# Patient Record
Sex: Male | Born: 1990 | Race: Black or African American | Hispanic: No | Marital: Single
Health system: Southern US, Community
[De-identification: ages and names within clinical notes are randomized; demographics above are authoritative.]

## PROBLEM LIST (undated history)

## (undated) HISTORY — PX: NO PAST SURGERIES: SHX2092

---
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
## 2010-09-04 ENCOUNTER — Emergency Department (HOSPITAL_COMMUNITY)
Admission: EM | Admit: 2010-09-04 | Discharge: 2010-09-04 | Disposition: A | Discharge status: Home / Self Care | Payer: Self-pay | Attending: Emergency Medicine | Admitting: Emergency Medicine

## 2010-09-04 DIAGNOSIS — R61 Generalized hyperhidrosis: Secondary | ICD-10-CM | POA: Insufficient documentation

## 2010-09-04 DIAGNOSIS — B9789 Other viral agents as the cause of diseases classified elsewhere: Secondary | ICD-10-CM | POA: Insufficient documentation

## 2010-09-04 DIAGNOSIS — R112 Nausea with vomiting, unspecified: Secondary | ICD-10-CM | POA: Insufficient documentation

## 2010-09-04 DIAGNOSIS — R109 Unspecified abdominal pain: Secondary | ICD-10-CM | POA: Insufficient documentation

## 2012-09-15 ENCOUNTER — Emergency Department (HOSPITAL_COMMUNITY): Payer: Self-pay

## 2012-09-15 ENCOUNTER — Encounter (HOSPITAL_COMMUNITY): Payer: Self-pay | Admitting: Emergency Medicine

## 2012-09-15 ENCOUNTER — Emergency Department (HOSPITAL_COMMUNITY)
Admission: EM | Admit: 2012-09-15 | Discharge: 2012-09-15 | Disposition: A | Discharge status: Home / Self Care | Payer: Self-pay | Attending: Emergency Medicine | Admitting: Emergency Medicine

## 2012-09-15 DIAGNOSIS — F17200 Nicotine dependence, unspecified, uncomplicated: Secondary | ICD-10-CM | POA: Insufficient documentation

## 2012-09-15 DIAGNOSIS — R51 Headache: Secondary | ICD-10-CM

## 2012-09-15 DIAGNOSIS — L659 Nonscarring hair loss, unspecified: Secondary | ICD-10-CM

## 2012-09-15 MED ORDER — TRAMADOL HCL 50 MG PO TABS: 50.0000 mg | ORAL_TABLET | Freq: Four times a day (QID) | ORAL | Status: DC | PRN

## 2012-09-15 NOTE — ED Provider Notes (Signed)
History     CSN: [id number]  Arrival date & time 09/15/12  1252   First MD Initiated Contact with Patient 09/15/12 1711      Chief Complaint  Patient presents with  . Headache    (Consider location/radiation/quality/duration/timing/severity/associated sxs/prior treatment) HPI Comments: Patient comes to the ER for evaluation of headache. Patient reports that he has been having left-sided headaches almost daily for several months. Patient reports that he was recently in prison and could not see a doctor. When he would go to sick call there, they would give him Motrin or Tylenol but it did not help. Patient denies any head injury. He does not have associated photophobia, nausea, vomiting. There is no neck pain or stiffness. He has not had a fever. Patient denies numbness, tingling, weakness in the extremities.  Patient is a 22 y.o. male presenting with headaches.  Headache   History reviewed. No pertinent past medical history.  History reviewed. No pertinent past surgical history.  History reviewed. No pertinent family history.  History  Substance Use Topics  . Smoking status: Current Every Day Smoker  . Smokeless tobacco: Not on file  . Alcohol Use: Yes      Review of Systems  Neurological: Positive for headaches.  All other systems reviewed and are negative.    Allergies  Review of patient's allergies indicates no known allergies.  Home Medications  No current outpatient prescriptions on file.  BP 129/56  Pulse 71  Temp(Src) 98.3 F (36.8 C) (Oral)  Resp 18  SpO2 100%  Physical Exam  Constitutional: He is oriented to person, place, and time. He appears well-developed and well-nourished. No distress.  HENT:  Head: Normocephalic and atraumatic.  Right Ear: Hearing normal.  Nose: Nose normal.  Mouth/Throat: Oropharynx is clear and moist and mucous membranes are normal.  Eyes: Conjunctivae and EOM are normal. Pupils are equal, round, and reactive to light.   Neck: Normal range of motion. Neck supple.  Cardiovascular: Normal rate, regular rhythm, S1 normal and S2 normal.  Exam reveals no gallop and no friction rub.   No murmur heard. Pulmonary/Chest: Effort normal and breath sounds normal. No respiratory distress. He exhibits no tenderness.  Abdominal: Soft. Normal appearance and bowel sounds are normal. There is no hepatosplenomegaly. There is no tenderness. There is no rebound, no guarding, no tenderness at McBurney's point and negative Murphy's sign. No hernia.  Musculoskeletal: Normal range of motion.  Neurological: He is alert and oriented to person, place, and time. He has normal strength. No cranial nerve deficit or sensory deficit. Coordination normal. GCS eye subscore is 4. GCS verbal subscore is 5. GCS motor subscore is 6.  Reflex Scores:      Tricep reflexes are 2+ on the right side and 2+ on the left side.      Bicep reflexes are 2+ on the right side and 2+ on the left side.      Patellar reflexes are 2+ on the right side and 2+ on the left side. Skin: Skin is warm, dry and intact. No rash noted. No cyanosis.  Psychiatric: He has a normal mood and affect. His speech is normal and behavior is normal. Thought content normal.    ED Course  Procedures (including critical care time)  Labs Reviewed - No data to display No results found.   Diagnosis: Headache    MDM  Patient comes to the ER for evaluation of headache. Patient reports that he has had a headache for weeks, probably months.  It seems like he has been having intermittent headaches, no continuous headache. Patient has normal neurologic function. Patient chronicity of these headaches it did not suspect subarachnoid hemorrhage. A CT scan was performed to further evaluate and was normal. Patient reassured, will be treated with anti-inflammatory and analgesia medications.        Gilda Crease, MD 09/15/12 [phone]

## 2012-09-15 NOTE — ED Notes (Signed)
Pt c/o HA and hair loss x several weeks; pt sts pain in left side of head

## 2012-09-15 NOTE — ED Notes (Signed)
Pt states he got out of prison on Sunday, has been seen there for h/a, pt states he has been having them for 5 mo. Pt states that h/a are intermittent, are always on the left side of head, denies sensitivity to light/sound. Pt states he has taken tylenol/ibuprofen with no relief.

## 2013-08-28 ENCOUNTER — Encounter (HOSPITAL_COMMUNITY): Payer: Self-pay | Admitting: Emergency Medicine

## 2013-08-28 ENCOUNTER — Emergency Department (HOSPITAL_COMMUNITY)
Admission: EM | Admit: 2013-08-28 | Discharge: 2013-08-28 | Disposition: A | Discharge status: Home / Self Care | Payer: Self-pay | Attending: Emergency Medicine | Admitting: Emergency Medicine

## 2013-08-28 DIAGNOSIS — F17200 Nicotine dependence, unspecified, uncomplicated: Secondary | ICD-10-CM | POA: Insufficient documentation

## 2013-08-28 DIAGNOSIS — B356 Tinea cruris: Secondary | ICD-10-CM | POA: Insufficient documentation

## 2013-08-28 MED ORDER — KETOCONAZOLE 2 % EX CREA: 1.0000 "application " | TOPICAL_CREAM | Freq: Every day | CUTANEOUS | Status: DC

## 2013-08-28 NOTE — ED Provider Notes (Signed)
Medical screening examination/treatment/procedure(s) were performed by non-physician practitioner and as supervising physician I was immediately available for consultation/collaboration.   EKG Interpretation None        Enid SkeensJoshua M Jozey Janco, MD 08/28/13 [phone]

## 2013-08-28 NOTE — ED Notes (Signed)
Pt states rash to lower abdomen and L thigh region. Ongoing for several days. C/o itching and discomfort. No signs of distress noted. Respirations unlabored.

## 2013-08-28 NOTE — ED Notes (Signed)
PT ambulated with baseline gait; VSS; A&Ox3; no signs of distress; respirations even and unlabored; skin warm and dry; no questions upon discharge.  

## 2013-08-28 NOTE — ED Notes (Signed)
Pt presents to department for evaluation of rash to lower abdomen and L thigh area. Ongoing x5 days. Pt states itching and discomfort. Pt is alert and oriented x4.

## 2013-08-28 NOTE — ED Provider Notes (Signed)
CSN: [id number]     Arrival date & time 08/28/13  1159 History  This chart was scribed for non-physician practitioner, Emilia BeckKaitlyn Abriel Hattery, PA-C, working with Enid SkeensJoshua M Zavitz, MD by Shari HeritageAisha Amuda, ED Scribe. This patient was seen in room TR03C/TR03C and the patient's care was started at 12:44 PM.    Chief Complaint  Patient presents with  . Rash     Patient is a 23 y.o. male presenting with rash. The history is provided by the patient. No language interpreter was used.  Rash Location:  Leg and ano-genital Ano-genital rash location:  Groin Leg rash location:  L upper leg Quality: itchiness   Duration:  5 days Timing:  Constant Associated symptoms: no fever, no nausea, no shortness of breath, no sore throat and not vomiting      HPI Comments: Candace Cruiseevin K Allshouse is a 23 y.o. male who presents to the Emergency Department complaining of a constant rash to his left inner thigh and groin onset 5 days ago. He states that rash is itchy occasionally. He denies fever, sore throat, nausea, vomiting, shortness or breath or any other symptoms at this time. Patient states that he does not work out frequently. He denies contact with a similar rash. He has no chronic medical conditions.    History reviewed. No pertinent past medical history. History reviewed. No pertinent past surgical history. History reviewed. No pertinent family history. History  Substance Use Topics  . Smoking status: Current Every Day Smoker    Types: Cigarettes  . Smokeless tobacco: Not on file  . Alcohol Use: Yes    Review of Systems  Constitutional: Negative for fever.  HENT: Negative for sore throat.   Respiratory: Negative for shortness of breath.   Gastrointestinal: Negative for nausea and vomiting.  Skin: Positive for rash.  All other systems reviewed and are negative.      Allergies  Review of patient's allergies indicates no known allergies.  Home Medications   Current Outpatient Rx  Name  Route  Sig   Dispense  Refill  . ketoconazole (NIZORAL) 2 % cream   Topical   Apply 1 application topically daily.   15 g   0    Triage Vitals: BP 103/82  Pulse 61  Temp(Src) 98.7 F (37.1 C) (Oral)  Resp 18  SpO2 100% Physical Exam  Nursing note and vitals reviewed. Constitutional: He is oriented to person, place, and time. He appears well-developed and well-nourished. No distress.  HENT:  Head: Normocephalic and atraumatic.  Eyes: EOM are normal.  Neck: Neck supple. No tracheal deviation present.  Cardiovascular: Normal rate.   Pulmonary/Chest: Effort normal. No respiratory distress.  Musculoskeletal: Normal range of motion.  Neurological: He is alert and oriented to person, place, and time.  Skin: Rash noted.  Dry scaly patches noted at left inner thigh and public area.  Psychiatric: He has a normal mood and affect. His behavior is normal.    ED Course  Procedures (including critical care time) DIAGNOSTIC STUDIES: Oxygen Saturation is 100% on room air, normal by my interpretation.    COORDINATION OF CARE: 12:47 PM- Patient presents with rash consistent with fungal infection. Will prescribe ketoconazole to treat. Patient informed of current plan for treatment and evaluation and agrees with plan at this time.      MDM   Final diagnoses:  Tinea cruris    12:56 PM Patient likely has tinea cruris. Patient will be discharged with ketoconazole cream.   I personally performed the services described  in this documentation, which was scribed in my presence. The recorded information has been reviewed and is accurate.    Emilia Beck, PA-C 08/28/13 1256

## 2013-08-28 NOTE — Discharge Instructions (Signed)
Use ketoconazole cream until symptoms resolve. Refer to attached documents for more information.

## 2013-10-20 ENCOUNTER — Encounter (HOSPITAL_COMMUNITY): Payer: Self-pay | Admitting: Emergency Medicine

## 2013-10-20 ENCOUNTER — Emergency Department (HOSPITAL_COMMUNITY)
Admission: EM | Admit: 2013-10-20 | Discharge: 2013-10-20 | Disposition: A | Discharge status: Home / Self Care | Payer: Self-pay | Attending: Emergency Medicine | Admitting: Emergency Medicine

## 2013-10-20 DIAGNOSIS — F17200 Nicotine dependence, unspecified, uncomplicated: Secondary | ICD-10-CM | POA: Insufficient documentation

## 2013-10-20 DIAGNOSIS — Z202 Contact with and (suspected) exposure to infections with a predominantly sexual mode of transmission: Secondary | ICD-10-CM | POA: Insufficient documentation

## 2013-10-20 DIAGNOSIS — R21 Rash and other nonspecific skin eruption: Secondary | ICD-10-CM | POA: Insufficient documentation

## 2013-10-20 LAB — HIV ANTIBODY (ROUTINE TESTING W REFLEX): HIV: NONREACTIVE

## 2013-10-20 LAB — RPR

## 2013-10-20 MED ORDER — CEFTRIAXONE SODIUM 250 MG IJ SOLR
250.0000 mg | Freq: Once | INTRAMUSCULAR | Status: AC
  Administered 2013-10-20: 250 mg via INTRAMUSCULAR
  Filled 2013-10-20: qty 250

## 2013-10-20 MED ORDER — LIDOCAINE HCL (PF) 1 % IJ SOLN
INTRAMUSCULAR | Status: AC
  Filled 2013-10-20: qty 5

## 2013-10-20 MED ORDER — AZITHROMYCIN 250 MG PO TABS
1000.0000 mg | ORAL_TABLET | Freq: Once | ORAL | Status: AC
Start: 2013-10-20 — End: 2013-10-20
  Administered 2013-10-20: 1000 mg via ORAL
  Filled 2013-10-20: qty 4

## 2013-10-20 NOTE — ED Provider Notes (Signed)
CSN: [id number]     Arrival date & time 10/20/13  1602 History  This chart was scribed for non-physician practitioner Dierdre ForthHannah Adalin Vanderploeg, PA-C working with Ethelda ChickMartha K Linker, MD by Dorothey Basemania Sutton, ED Scribe. This patient was seen in room TR06C/TR06C and the patient's care was started at 4:53 PM.    Chief Complaint  Patient presents with  . Exposure to STD   The history is provided by the patient and medical records. No language interpreter was used.   HPI Comments: Randy Wagner is a 23 y.o. male with a history of gonorrhea and chlamydia about 4 years ago who presents to the Emergency Department complaining of STD exposure. He reports that he was recently notified that his partner has chlamydia and gonorrhea and states that he has not had intercourse with this individual since their diagnosis. Patient reports an unassociated rash to the left thigh that he states he has been evaluated for. Patient reports that he was told it was a fungal infection and given a topical cream, which he states appears to be improving the rash. He denies fever, chills, nausea, vomiting, abdominal pain, penile discharge, testicular pain, scrotal swelling, or genital lesions. Patient has no other pertinent medical history.   History reviewed. No pertinent past medical history. History reviewed. No pertinent past surgical history. History reviewed. No pertinent family history. History  Substance Use Topics  . Smoking status: Current Every Day Smoker    Types: Cigarettes  . Smokeless tobacco: Not on file  . Alcohol Use: Yes    Review of Systems  Constitutional: Negative for fever and chills.  Gastrointestinal: Negative for nausea, vomiting and abdominal pain.  Genitourinary: Negative for discharge, scrotal swelling, genital sores and testicular pain.  Skin: Positive for rash.  All other systems reviewed and are negative.  Allergies  Review of patient's allergies indicates no known allergies.  Home Medications    Prior to Admission medications   Not on File   Triage Vitals: BP 121/59  Pulse 69  Temp(Src) 98.1 F (36.7 C)  Resp 18  Ht 6' (1.829 m)  Wt 170 lb (77.111 kg)  BMI 23.05 kg/m2  SpO2 98%  Physical Exam  Nursing note and vitals reviewed. Constitutional: He appears well-developed and well-nourished. No distress.  Awake, alert, nontoxic appearance  HENT:  Head: Normocephalic and atraumatic.  Mouth/Throat: Oropharynx is clear and moist. No oropharyngeal exudate.  Eyes: Conjunctivae are normal. No scleral icterus.  Neck: Normal range of motion. Neck supple.  Cardiovascular: Normal rate, regular rhythm and intact distal pulses.   Pulmonary/Chest: Effort normal and breath sounds normal. No respiratory distress. He has no wheezes.  Abdominal: Soft. Bowel sounds are normal. He exhibits no mass. There is no tenderness. There is no rebound and no guarding. Hernia confirmed negative in the right inguinal area and confirmed negative in the left inguinal area.  Genitourinary: Testes normal. Cremasteric reflex is present. Right testis shows no mass, no swelling and no tenderness. Right testis is descended. Left testis shows no mass, no swelling and no tenderness. Left testis is descended. Circumcised. No phimosis, paraphimosis, hypospadias, penile erythema or penile tenderness. Discharge found.  Chaperone (scribe) was present for exam which was performed with no discomfort or complications.   Clear discharge from the tip of the urethra.    Musculoskeletal: Normal range of motion. He exhibits no edema.  Lymphadenopathy:       Right: No inguinal adenopathy present.       Left: No inguinal adenopathy present.  Neurological: He is alert.  Speech is clear and goal oriented Moves extremities without ataxia  Skin: Skin is warm and dry. He is not diaphoretic.  Circular, erythematous lesions with central clearing and scaling to left inner and outer thigh.   Psychiatric: He has a normal mood and  affect.    ED Course  Procedures (including critical care time)  DIAGNOSTIC STUDIES: Oxygen Saturation is 98% on room air, normal by my interpretation.    COORDINATION OF CARE: 4:57 PM- Performed STD testing. Will go ahead and treat for gonorrhea and chlamydia. Discussed that he will be notified of his test results in a few days. Discussed treatment plan with patient at bedside and patient verbalized agreement.     Labs Review Labs Reviewed  GC/CHLAMYDIA PROBE AMP  RPR  HIV ANTIBODY (ROUTINE TESTING)    Imaging Review No results found.   EKG Interpretation None      MDM   Final diagnoses:  Exposure to STD    Randy Wagner presents after exposure to both Gonorrhea and Chlamydia from a male partner.  STD cultures obtained including gonorrhea, chlamydia, HIV and Syphilis. Patient to be discharged with instructions to follow up with PCP. Discussed importance of using protection when sexually active. Pt understands that they have GC/Chlamydia cultures pending and that they will need to inform all sexual partners if results return positive. Pt treated prophylactically with Azithromycin and Rocephin. I have also discussed reasons to return immediately to the ER including pain in the testicles, difficulty urinating or high fevers. Patient expresses understanding and agrees with plan.  Pt also with fungal rash to the left thigh consistent with ringworm.  Pt reports he is being treated for this and the rash is improving.  There is no rash to the groin area, on the testicles or on the penis.  I do not believe the rash is associated with the STD exposure.    It has been determined that no acute conditions requiring further emergency intervention are present at this time. The patient/guardian have been advised of the diagnosis and plan. We have discussed signs and symptoms that warrant return to the ED, such as changes or worsening in symptoms.   Vital signs are stable at discharge.    BP 121/59  Pulse 69  Temp(Src) 98.1 F (36.7 C)  Resp 18  Ht 6' (1.829 m)  Wt 170 lb (77.111 kg)  BMI 23.05 kg/m2  SpO2 98%  Patient/guardian has voiced understanding and agreed to follow-up with the PCP or specialist.    I personally performed the services described in this documentation, which was scribed in my presence. The recorded information has been reviewed and is accurate.   Dahlia ClientHannah Chae Shuster, PA-C 10/20/13 1709

## 2013-10-20 NOTE — ED Notes (Signed)
Per pt sts he was exposed to STD. Chlamydia and Gonorrhea.

## 2013-10-20 NOTE — ED Provider Notes (Signed)
Medical screening examination/treatment/procedure(s) were performed by non-physician practitioner and as supervising physician I was immediately available for consultation/collaboration.   EKG Interpretation None       Ethelda ChickMartha K Linker, MD 10/20/13 1740

## 2013-10-20 NOTE — Discharge Instructions (Signed)
1. Medications: usual home medications 2. Treatment: rest, drink plenty of fluids,  3. Follow Up: Please followup with your primary doctor for discussion of your diagnoses and further evaluation after today's visit; if you do not have a primary care doctor use the resource guide provided to find one;    You have been treated in the emergency department for an infection, possibly sexually transmitted. Results of your gonorrhea and chlamydia tests are pending and you will be notified if they are positive. It is very important to practice safe sex and use condoms when sexually active. If your results are positive you need to notify all sexual partners so they can be treated as well. The website [email or web address].[email or web address] can be used to send anonymous text messages or emails to alert sexual contacts. Follow up with your doctor, or OBGYN in regards to today's visit.    Gonorrhea and Chlamydia SYMPTOMS  In females, symptoms may go unnoticed. Symptoms that are more noticeable can include:  Belly (abdominal) pain.  Painful intercourse.  Watery mucous-like discharge from the vagina.  Miscarriage.  Discomfort when urinating.  Inflammation of the rectum.  Abnormal gray-green frothy vaginal discharge  Vaginal itching and irritatio  Itching and irritation of the area outside the vagina.   Painful urination.  Bleeding after sexual intercourse.  In males, symptoms include:  Burning with urination.  Pain in the testicles.  Watery mucous-like discharge from the penis.  It can cause longstanding (chronic) pelvic pain after frequent infections.  TREATMENT  PID can cause women to not be able to have children (sterile) if left untreated or if half-treated.  It is important to finish ALL medications given to you.  This is a sexually transmitted infection. So you are also at risk for other sexually transmitted diseases, including HIV (AIDS), it is recommended that you get tested. HOME CARE INSTRUCTIONS    Warning: This infection is contagious. Do not have sex until treatment is completed. Follow up at your caregiver's office or the clinic to which you were referred. If your diagnosis (learning what is wrong) is confirmed by culture or some other method, your recent sexual contacts need treatment. Even if they are symptom free or have a negative culture or evaluation, they should be treated.  PREVENTION  Women should use sanitary pads instead of tampons for vaginal discharge.  Wipe front to back after using the toilet and avoid douching.   Practice safe sex, use condoms, have only one sex partner and be sure your sex partner is not having sex with others.  Ask your caregiver to test you for chlamydia at your regular checkups or sooner if you are having symptoms.  Ask for further information if you are pregnant.  SEEK IMMEDIATE MEDICAL CARE IF:  You develop an oral temperature above 102 F (38.9 C), not controlled by medications or lasting more than 2 days.  You develop an increase in pain.  You develop any type of abnormal discharge.  You develop vaginal bleeding and it is not time for your period.  You develop painful intercourse.   Bacterial Vaginosis  Bacterial vaginosis (BV) is a vaginal infection where the normal balance of bacteria in the vagina is disrupted. This is not a sexually transmitted disease and your sexual partners do NOT need to be treated. CAUSES  The cause of BV is not fully understood. BV develops when there is an increase or imbalance of harmful bacteria.  Some activities or behaviors can upset the normal balance of bacteria in  the vagina and put women at increased risk including:  Having a new sex partner or multiple sex partners.  Douching.  Using an intrauterine device (IUD) for contraception.  It is not clear what role sexual activity plays in the development of BV. However, women that have never had sexual intercourse are rarely infected with BV.  Women do not get  BV from toilet seats, bedding, swimming pools or from touching objects around them.   SYMPTOMS  Grey vaginal discharge.  A fish-like odor with discharge, especially after sexual intercourse.  Itching or burning of the vagina and vulva.  Burning or pain with urination.  Some women have no signs or symptoms at all.   TREATMENT  Sometimes BV will clear up without treatment.  BV may be treated with antibiotics.  BV can recur after treatment. If this happens, a second round of antibiotics will often be prescribed.  HOME CARE INSTRUCTIONS  Finish all medication as directed by your caregiver.  Do not have sex until treatment is completed.  Do NOT drink any alcoholic beverages while being treated  with Metronidazole (Flagyl). This will cause a severe reaction inducing vomiting.  RESOURCE GUIDE  Dental Problems  Patients with Medicaid: [facility]                     [facility] [phone]                                           [id number] Phone:  [phone]                                                  Phone:  [phone]  If unable to pay or uninsured, contact:  [facility] or [facility] to become qualified for the adult dental clinic.  Chronic Pain Problems Contact [facility]  [phone] Patients need to be referred by their primary care doctor.  Insufficient Money for Medicine Contact [employer]:  call "211" or [facility] [phone].  No Primary Care Doctor Call [facility]  [phone] Other agencies that provide inexpensive medical care    [facility]  [phone]    [facility]  [phone]    [facility]  [phone]    [facility]  [phone]    [facility]  [phone]    [facility]  [phone]  Psychological Services [facility]  [phone] [facility]  [phone] [facility]   [phone] (emergency  services [phone])  Substance Abuse Resources [facility]  [phone] [facility] [phone] [facility] [phone] [facility] [phone] Residential & Outpatient Substance Abuse Program  [phone]  Abuse/Neglect [facility] Hotline [phone] [phone] [facility] Hotline [phone] (After Hours)  Emergency Shelter [facility] [phone]  M[facility] [phone] [facility] [phone]  MRSA Hotline #:   704-603-55026313787821    [facility]  [facility]     [employer]  [facility] °[address] [place]                       [address]      [address]  °[place]                                                [place]                            [place] °Phone:  [phone]                                   Phone:  [phone]                 Phone:  [phone] ° °[facility] °Phone:  342-8316 ° °[place] Child Abuse Hotline °(336) 342-1394 °([phone]) [phone] (After Hours) ° ° ° ° °

## 2013-10-21 LAB — GC/CHLAMYDIA PROBE AMP
CT Probe RNA: NEGATIVE
GC Probe RNA: NEGATIVE

## 2014-01-06 ENCOUNTER — Encounter (HOSPITAL_COMMUNITY): Payer: Self-pay | Admitting: Emergency Medicine

## 2014-01-06 ENCOUNTER — Emergency Department (HOSPITAL_COMMUNITY)
Admission: EM | Admit: 2014-01-06 | Discharge: 2014-01-06 | Disposition: A | Discharge status: Home / Self Care | Payer: Self-pay | Attending: Emergency Medicine | Admitting: Emergency Medicine

## 2014-01-06 DIAGNOSIS — R300 Dysuria: Secondary | ICD-10-CM | POA: Insufficient documentation

## 2014-01-06 DIAGNOSIS — F17200 Nicotine dependence, unspecified, uncomplicated: Secondary | ICD-10-CM | POA: Insufficient documentation

## 2014-01-06 DIAGNOSIS — R369 Urethral discharge, unspecified: Secondary | ICD-10-CM | POA: Insufficient documentation

## 2014-01-06 LAB — GC/CHLAMYDIA PROBE AMP
CT PROBE, AMP APTIMA: NEGATIVE
GC Probe RNA: POSITIVE — AB

## 2014-01-06 MED ORDER — CLOTRIMAZOLE 1 % EX CREA: TOPICAL_CREAM | CUTANEOUS | Status: DC

## 2014-01-06 MED ORDER — LIDOCAINE HCL (PF) 1 % IJ SOLN
5.0000 mL | Freq: Once | INTRAMUSCULAR | Status: AC
  Administered 2014-01-06: 0.9 mL
  Filled 2014-01-06: qty 5

## 2014-01-06 MED ORDER — AZITHROMYCIN 250 MG PO TABS
1000.0000 mg | ORAL_TABLET | Freq: Once | ORAL | Status: AC
  Administered 2014-01-06: 1000 mg via ORAL
  Filled 2014-01-06: qty 4

## 2014-01-06 MED ORDER — CEFTRIAXONE SODIUM 250 MG IJ SOLR
250.0000 mg | Freq: Once | INTRAMUSCULAR | Status: AC
  Administered 2014-01-06: 250 mg via INTRAMUSCULAR
  Filled 2014-01-06: qty 250

## 2014-01-06 NOTE — Discharge Instructions (Signed)

## 2014-01-06 NOTE — ED Provider Notes (Signed)
CSN: [id number]     Arrival date & time 01/06/14  0055 History   First MD Initiated Contact with Patient 01/06/14 [phone]     Chief Complaint  Patient presents with  . Penile Discharge     (Consider location/radiation/quality/duration/timing/severity/associated sxs/prior Treatment) HPI Comments:  Patient presents emergency department with chief complaint of penile discharge times one week. He states that he has had new sexual contacts. He also complains of mild dysuria. He has not tried taking anything to alleviate his symptoms. Denies any fevers chills. Denies any pain in his testicles. Additionally, he complains of ringworm. States that he has run out of his cream and asks for a refill.  The history is provided by the patient. No language interpreter was used.    History reviewed. No pertinent past medical history. History reviewed. No pertinent past surgical history. No family history on file. History  Substance Use Topics  . Smoking status: Current Every Day Smoker    Types: Cigarettes  . Smokeless tobacco: Not on file  . Alcohol Use: Yes    Review of Systems  Constitutional: Negative for fever and chills.  Respiratory: Negative for shortness of breath.   Cardiovascular: Negative for chest pain.  Gastrointestinal: Negative for nausea, vomiting, diarrhea and constipation.  Genitourinary: Positive for penile pain. Negative for dysuria.      Allergies  Review of patient's allergies indicates no known allergies.  Home Medications   Prior to Admission medications   Not on File   BP 120/75  Pulse 51  Temp(Src) 97.7 F (36.5 C) (Oral)  Resp 18  SpO2 99% Physical Exam  Nursing note and vitals reviewed. Constitutional: He is oriented to person, place, and time. He appears well-developed and well-nourished.  HENT:  Head: Normocephalic and atraumatic.  Eyes: Conjunctivae and EOM are normal.  Neck: Normal range of motion.  Cardiovascular: Normal rate.   Pulmonary/Chest:  Effort normal.  Abdominal: He exhibits no distension.  Genitourinary:  Circumcised male, mild thick white penile discharge, no other abnormality about the penis, scrotum, testicles  Musculoskeletal: Normal range of motion.  Neurological: He is alert and oriented to person, place, and time.  Skin: Skin is dry.  Tinea infection on upper left thigh, circular in appearance with central clearing, and scaling on the outer edge of the lesion  Psychiatric: He has a normal mood and affect. His behavior is normal. Judgment and thought content normal.    ED Course  Procedures (including critical care time) Labs Review Labs Reviewed - No data to display  Imaging Review No results found.   EKG Interpretation None      MDM   Final diagnoses:  Penile discharge    Patient with penile discharge. Will treat with ceftriaxone, azithromycin. Will give Lotrimin for ringworm. Patient is stable and ready for discharge.    Roxy Horsemanobert Folashade Gamboa, PA-C 01/06/14 [phone]

## 2014-01-06 NOTE — ED Provider Notes (Signed)
Medical screening examination/treatment/procedure(s) were performed by non-physician practitioner and as supervising physician I was immediately available for consultation/collaboration.   EKG Interpretation None        Gao Mitnick, MD 01/06/14 0533 

## 2014-01-06 NOTE — ED Notes (Signed)
C/o burning with urination and white penile discharge x 1 week.  Also reports circular rash/fungal infection on L upper thigh/groin that he was seen for 1 month ago and using cream without relief.

## 2014-01-07 ENCOUNTER — Telehealth (HOSPITAL_BASED_OUTPATIENT_CLINIC_OR_DEPARTMENT_OTHER): Payer: Self-pay | Admitting: Emergency Medicine

## 2014-01-07 NOTE — Telephone Encounter (Signed)
Patient returned call, ID verified. Notified of positive Gonorrhea. STD instructions provided, patient verbalized understanding

## 2014-01-07 NOTE — Telephone Encounter (Signed)
Post ED Visit - Positive Culture Follow-up  []    Positive Gonorrhea culture Treated with Rocephin and Zithromax, organism sensitive to the same and no further patient follow-up is required at this time.  01/07/14 @ 1020, attempt to contact pt, left message to call flow managers#  Jiles HaroldGammons, Haila Dena Chaney 01/07/2014, 10:22 AM

## 2014-03-21 ENCOUNTER — Emergency Department (HOSPITAL_COMMUNITY)
Admission: EM | Admit: 2014-03-21 | Discharge: 2014-03-21 | Disposition: A | Discharge status: Home / Self Care | Payer: Self-pay | Attending: Emergency Medicine | Admitting: Emergency Medicine

## 2014-03-21 ENCOUNTER — Encounter (HOSPITAL_COMMUNITY): Payer: Self-pay | Admitting: Emergency Medicine

## 2014-03-21 DIAGNOSIS — A64 Unspecified sexually transmitted disease: Secondary | ICD-10-CM | POA: Insufficient documentation

## 2014-03-21 DIAGNOSIS — R369 Urethral discharge, unspecified: Secondary | ICD-10-CM | POA: Insufficient documentation

## 2014-03-21 DIAGNOSIS — F17200 Nicotine dependence, unspecified, uncomplicated: Secondary | ICD-10-CM | POA: Insufficient documentation

## 2014-03-21 LAB — URINALYSIS, ROUTINE W REFLEX MICROSCOPIC
Glucose, UA: NEGATIVE mg/dL
KETONES UR: 15 mg/dL — AB
NITRITE: NEGATIVE
PROTEIN: 30 mg/dL — AB
Specific Gravity, Urine: 1.035 — ABNORMAL HIGH (ref 1.005–1.030)
UROBILINOGEN UA: 1 mg/dL (ref 0.0–1.0)
pH: 6.5 (ref 5.0–8.0)

## 2014-03-21 LAB — URINE MICROSCOPIC-ADD ON

## 2014-03-21 MED ORDER — METRONIDAZOLE 500 MG PO TABS
2000.0000 mg | ORAL_TABLET | Freq: Once | ORAL | Status: AC
  Administered 2014-03-21: 2000 mg via ORAL
  Filled 2014-03-21: qty 4

## 2014-03-21 MED ORDER — CEFTRIAXONE SODIUM 250 MG IJ SOLR
250.0000 mg | Freq: Once | INTRAMUSCULAR | Status: AC
  Administered 2014-03-21: 250 mg via INTRAMUSCULAR
  Filled 2014-03-21: qty 250

## 2014-03-21 MED ORDER — AZITHROMYCIN 250 MG PO TABS
1000.0000 mg | ORAL_TABLET | Freq: Once | ORAL | Status: AC
  Administered 2014-03-21: 1000 mg via ORAL
  Filled 2014-03-21: qty 4

## 2014-03-21 MED ORDER — LIDOCAINE HCL (PF) 1 % IJ SOLN
0.9000 mL | Freq: Once | INTRAMUSCULAR | Status: AC
  Administered 2014-03-21: 0.9 mL via INTRADERMAL
  Filled 2014-03-21: qty 5

## 2014-03-21 NOTE — ED Provider Notes (Signed)
CSN: [id number]     Arrival date & time 03/21/14  1825 History  This chart was scribed for Harle Battiest, NP working with Hurman Horn, MD by Evon Slack, ED Scribe. This patient was seen in room TR05C/TR05C and the patient's care was started at 7:36 PM.    Chief Complaint  Patient presents with  . Penile Discharge   Patient is a 23 y.o. male presenting with penile discharge. The history is provided by the patient. No language interpreter was used.  Penile Discharge Pertinent negatives include no abdominal pain.   HPI Comments: Randy Wagner is a 23 y.o. male who presents to the Emergency Department complaining of penile white discharge onset 2 days priors. He states he has associated intermittent dysuria. He states that he does have some redness around the head of the penis. He states he has recently had new sexual partners. He states he has a HX of STD about 2 months prior. Denies fever, nausea, vomiting, abdominal pain or testicle pain.    History reviewed. No pertinent past medical history. History reviewed. No pertinent past surgical history. No family history on file. History  Substance Use Topics  . Smoking status: Current Every Day Smoker    Types: Cigarettes  . Smokeless tobacco: Not on file  . Alcohol Use: Yes    Review of Systems  Constitutional: Negative for fever.  Gastrointestinal: Negative for nausea, vomiting and abdominal pain.  Genitourinary: Positive for dysuria and discharge. Negative for testicular pain.    Allergies  Review of patient's allergies indicates no known allergies.  Home Medications   Prior to Admission medications   Medication Sig Start Date End Date Taking? Authorizing Provider  clotrimazole (LOTRIMIN) 1 % cream Apply to affected areas 01/06/14   Roxy Horseman, PA-C   Triage Vitals: BP 125/67  Pulse 70  Temp(Src) 97.8 F (36.6 C)  Resp 18  Wt 156 lb 5 oz (70.903 kg)  SpO2 98%  Physical Exam  Nursing note and vitals  reviewed. Constitutional: He is oriented to person, place, and time. He appears well-developed and well-nourished. No distress.  HENT:  Head: Normocephalic and atraumatic.  Eyes: Conjunctivae and EOM are normal.  Neck: Neck supple. No tracheal deviation present.  Cardiovascular: Normal rate.   Pulmonary/Chest: Effort normal. No respiratory distress.  Genitourinary: Right testis shows no tenderness. Left testis shows no tenderness. Circumcised. No penile erythema or penile tenderness. Discharge found.  no lesion on penis clear white discharge  Musculoskeletal: Normal range of motion.  Neurological: He is alert and oriented to person, place, and time.  Skin: Skin is warm and dry.  Psychiatric: He has a normal mood and affect. His behavior is normal.    ED Course  Procedures (including critical care time) DIAGNOSTIC STUDIES: Oxygen Saturation is 98% on RA, normal by my interpretation.    Labs Review Labs Reviewed  GC/CHLAMYDIA PROBE AMP - Abnormal; Notable for the following:           URINALYSIS, ROUTINE W REFLEX MICROSCOPIC - Abnormal; Notable for the following:    Color, Urine AMBER (*)    APPearance CLOUDY (*)    Specific Gravity, Urine 1.035 (*)    Hgb urine dipstick TRACE (*)    Bilirubin Urine SMALL (*)    Ketones, ur 15 (*)    Protein, ur 30 (*)    Leukocytes, UA MODERATE (*)    All other components within normal limits  URINE MICROSCOPIC-ADD ON - Abnormal; Notable for the following:  Bacteria, UA FEW (*)    All other components within normal limits  RPR  HIV ANTIBODY (ROUTINE TESTING)   Imaging Review No results found.   EKG Interpretation None      MDM   Final diagnoses:  STI (sexually transmitted infection)   23 yo with dysuria and penile drainage.  Discussed importance of using protection when sexually active. Pt understands that they have GC/Chlamydia cultures pending and that they will need to inform all sexual partners if results return positive. Pt  has been treated prophylacticly with flagyl, azithromycin and rocephin.  Pt has been advised to not drink alcohol while on this medication. Discharge instructions include follow-up with health department or [facility].  Return precautions provided.   I personally performed the services described in this documentation, which was scribed in my presence. The recorded information has been reviewed and is accurate.  Filed Vitals:   03/21/14 1839 03/21/14 1945  BP: 125/67 124/73  Pulse: 70 88  Temp: 97.8 F (36.6 C) 98.1 F (36.7 C)  TempSrc:  Oral  Resp: 18 18  Weight: 156 lb 5 oz (70.903 kg)   SpO2: 98% 100%   Meds given in ED:  Medications  cefTRIAXone (ROCEPHIN) injection 250 mg (250 mg Intramuscular Given 03/21/14 2026)  metroNIDAZOLE (FLAGYL) tablet 2,000 mg (2,000 mg Oral Given 03/21/14 2025)  azithromycin (ZITHROMAX) tablet 1,000 mg (1,000 mg Oral Given 03/21/14 2025)  lidocaine (PF) (XYLOCAINE) 1 % injection 0.9 mL (0.9 mLs Intradermal Given 03/21/14 2027)    Discharge Medication List as of 03/21/2014  8:10 PM         Harle Battiest, NP 03/26/14 1610

## 2014-03-21 NOTE — ED Notes (Signed)
Pt presents with penile discharge and "stinging" with urination for the past 3 days- admits to new sexual partners recently.

## 2014-03-21 NOTE — Discharge Instructions (Signed)
Please follow the directions provided.  Be sure to follow up with the [facility] center regarding your symptoms.    WHAT SHOULD I DO IF I THINK I HAVE AN STD?  See your health care provider.  Tell your sexual partner(s). They should be tested and treated for any STDs.  Do not have sex until your health care provider says it is okay.  WHEN SHOULD I GET IMMEDIATE MEDICAL CARE?  Contact your health care provider right away if:  You have severe abdominal pain.  You are a man and notice swelling or pain in your testicles.  You are a woman and notice swelling or pain in your vagina.

## 2014-03-22 LAB — RPR

## 2014-03-22 LAB — GC/CHLAMYDIA PROBE AMP
CT Probe RNA: NEGATIVE
GC Probe RNA: POSITIVE — AB

## 2014-03-22 LAB — HIV ANTIBODY (ROUTINE TESTING W REFLEX): HIV: NONREACTIVE

## 2014-03-25 ENCOUNTER — Telehealth (HOSPITAL_COMMUNITY): Payer: Self-pay

## 2014-03-30 NOTE — ED Provider Notes (Signed)
Medical screening examination/treatment/procedure(s) were performed by non-physician practitioner and as supervising physician I was immediately available for consultation/collaboration.   EKG Interpretation None       Advika Mclelland M Eligh Rybacki, MD 03/30/14 2043 

## 2015-07-17 ENCOUNTER — Encounter (HOSPITAL_COMMUNITY): Payer: Self-pay | Admitting: *Deleted

## 2015-07-17 ENCOUNTER — Emergency Department (HOSPITAL_COMMUNITY)
Admission: EM | Admit: 2015-07-17 | Discharge: 2015-07-17 | Disposition: A | Discharge status: Home / Self Care | Payer: Self-pay | Attending: Emergency Medicine | Admitting: Emergency Medicine

## 2015-07-17 DIAGNOSIS — F17210 Nicotine dependence, cigarettes, uncomplicated: Secondary | ICD-10-CM | POA: Insufficient documentation

## 2015-07-17 DIAGNOSIS — Z113 Encounter for screening for infections with a predominantly sexual mode of transmission: Secondary | ICD-10-CM | POA: Insufficient documentation

## 2015-07-17 DIAGNOSIS — Z711 Person with feared health complaint in whom no diagnosis is made: Secondary | ICD-10-CM

## 2015-07-17 DIAGNOSIS — Z79899 Other long term (current) drug therapy: Secondary | ICD-10-CM | POA: Insufficient documentation

## 2015-07-17 MED ORDER — CEFTRIAXONE SODIUM 250 MG IJ SOLR
250.0000 mg | Freq: Once | INTRAMUSCULAR | Status: AC
  Administered 2015-07-17: 250 mg via INTRAMUSCULAR
  Filled 2015-07-17: qty 250

## 2015-07-17 MED ORDER — STERILE WATER FOR INJECTION IJ SOLN
10.0000 mL | Freq: Once | INTRAMUSCULAR | Status: AC
Start: 2015-07-17 — End: 2015-07-17
  Administered 2015-07-17: 10 mL via INTRAMUSCULAR
  Filled 2015-07-17: qty 10

## 2015-07-17 MED ORDER — AZITHROMYCIN 250 MG PO TABS
1000.0000 mg | ORAL_TABLET | Freq: Once | ORAL | Status: AC
Start: 2015-07-17 — End: 2015-07-17
  Administered 2015-07-17: 1000 mg via ORAL
  Filled 2015-07-17: qty 4

## 2015-07-17 NOTE — ED Notes (Signed)
SEE PA asessment 

## 2015-07-17 NOTE — ED Provider Notes (Signed)
CSN: [id number]     Arrival date & time 07/17/15  0865 History   First MD Initiated Contact with Patient 07/17/15 0831     Chief Complaint  Patient presents with  . Exposure to STD     (Consider location/radiation/quality/duration/timing/severity/associated sxs/prior Treatment) HPI Comments: Patient presents to the emergency department with chief complaint of possible STD. He states that he has had some tingling in the tip his penis 3 days. He denies seeing any discharge. Denies any dysuria. Denies fevers chills. Denies any nausea, vomiting, or flank pain. He denies any new sexual contacts. Denies any pain or masses in his testicles or scrotum  The history is provided by the patient. No language interpreter was used.    No past medical history on file. No past surgical history on file. No family history on file. Social History  Substance Use Topics  . Smoking status: Current Every Day Smoker    Types: Cigarettes  . Smokeless tobacco: Not on file  . Alcohol Use: Yes    Review of Systems  Constitutional: Negative for fever and chills.  Respiratory: Negative for shortness of breath.   Cardiovascular: Negative for chest pain.  Gastrointestinal: Negative for nausea, vomiting, diarrhea and constipation.  Genitourinary: Negative for dysuria.      Allergies  Review of patient's allergies indicates no known allergies.  Home Medications   Prior to Admission medications   Medication Sig Start Date End Date Taking? Authorizing Provider  clotrimazole (LOTRIMIN) 1 % cream Apply to affected areas 01/06/14   Roxy Horseman, PA-C   BP 129/74 mmHg  Pulse 65  Temp(Src) 98 F (36.7 C) (Oral)  Resp 20  SpO2 100% Physical Exam  Constitutional: He is oriented to person, place, and time. He appears well-developed and well-nourished.  HENT:  Head: Normocephalic and atraumatic.  Eyes: Conjunctivae and EOM are normal.  Neck: Normal range of motion.  Cardiovascular: Normal rate.    Pulmonary/Chest: Effort normal.  Abdominal: He exhibits no distension.  Genitourinary:  Normal circumcised male, no obvious discharge, no masses or lesions on the penis or scrotum  Musculoskeletal: Normal range of motion.  Neurological: He is alert and oriented to person, place, and time.  Skin: Skin is dry.  Psychiatric: He has a normal mood and affect. His behavior is normal. Judgment and thought content normal.  Nursing note and vitals reviewed.   ED Course  Procedures (including critical care time)   MDM   Final diagnoses:  Concern about STD in male without diagnosis    Patient with tingling of penis. History of STD. Will treat with Rocephin and azithromycin. Recommended abstinence for 10 days. Return for new or worsening symptoms.     Roxy Horseman, PA-C 07/17/15 7846  Blane Ohara, MD 07/17/15 [phone]

## 2015-07-17 NOTE — ED Notes (Signed)
Pt reports urinary problem pt reports not using a condom.

## 2015-07-17 NOTE — Discharge Instructions (Signed)
Sexually Transmitted Disease °A sexually transmitted disease (STD) is a disease or infection that may be passed (transmitted) from person to person, usually during sexual activity. This may happen by way of saliva, semen, blood, vaginal mucus, or urine. Common STDs include: °· Gonorrhea. °· Chlamydia. °· Syphilis. °· HIV and AIDS. °· Genital herpes. °· Hepatitis B and C. °· Trichomonas. °· Human papillomavirus (HPV). °· Pubic lice. °· Scabies. °· Mites. °· Bacterial vaginosis. °WHAT ARE CAUSES OF STDs? °An STD may be caused by bacteria, a virus, or parasites. STDs are often transmitted during sexual activity if one person is infected. However, they may also be transmitted through nonsexual means. STDs may be transmitted after:  °· Sexual intercourse with an infected person. °· Sharing sex toys with an infected person. °· Sharing needles with an infected person or using unclean piercing or tattoo needles. °· Having intimate contact with the genitals, mouth, or rectal areas of an infected person. °· Exposure to infected fluids during birth. °WHAT ARE THE SIGNS AND SYMPTOMS OF STDs? °Different STDs have different symptoms. Some people may not have any symptoms. If symptoms are present, they may include: °· Painful or bloody urination. °· Pain in the pelvis, abdomen, vagina, anus, throat, or eyes. °· A skin rash, itching, or irritation. °· Growths, ulcerations, blisters, or sores in the genital and anal areas. °· Abnormal vaginal discharge with or without bad odor. °· Penile discharge in men. °· Fever. °· Pain or bleeding during sexual intercourse. °· Swollen glands in the groin area. °· Yellow skin and eyes (jaundice). This is seen with hepatitis. °· Swollen testicles. °· Infertility. °· Sores and blisters in the mouth. °HOW ARE STDs DIAGNOSED? °To make a diagnosis, your health care provider may: °· Take a medical history. °· Perform a physical exam. °· Take a sample of any discharge to examine. °· Swab the throat,  cervix, opening to the penis, rectum, or vagina for testing. °· Test a sample of your first morning urine. °· Perform blood tests. °· Perform a Pap test, if this applies. °· Perform a colposcopy. °· Perform a laparoscopy. °HOW ARE STDs TREATED? °Treatment depends on the STD. Some STDs may be treated but not cured. °· Chlamydia, gonorrhea, trichomonas, and syphilis can be cured with antibiotic medicine. °· Genital herpes, hepatitis, and HIV can be treated, but not cured, with prescribed medicines. The medicines lessen symptoms. °· Genital warts from HPV can be treated with medicine or by freezing, burning (electrocautery), or surgery. Warts may come back. °· HPV cannot be cured with medicine or surgery. However, abnormal areas may be removed from the cervix, vagina, or vulva. °· If your diagnosis is confirmed, your recent sexual partners need treatment. This is true even if they are symptom-free or have a negative culture or evaluation. They should not have sex until their health care providers say it is okay. °· Your health care provider may test you for infection again 3 months after treatment. °HOW CAN I REDUCE MY RISK OF GETTING AN STD? °Take these steps to reduce your risk of getting an STD: °· Use latex condoms, dental dams, and water-soluble lubricants during sexual activity. Do not use petroleum jelly or oils. °· Avoid having multiple sex partners. °· Do not have sex with someone who has other sex partners °· Do not have sex with anyone you do not know or who is at high risk for an STD. °· Avoid risky sex practices that can break your skin. °· Do not have sex   if you have open sores on your mouth or skin. °· Avoid drinking too much alcohol or taking illegal drugs. Alcohol and drugs can affect your judgment and put you in a vulnerable position. °· Avoid engaging in oral and anal sex acts. °· Get vaccinated for HPV and hepatitis. If you have not received these vaccines in the past, talk to your health care  provider about whether one or both might be right for you. °· If you are at risk of being infected with HIV, it is recommended that you take a prescription medicine daily to prevent HIV infection. This is called pre-exposure prophylaxis (PrEP). You are considered at risk if: °¨ You are a man who has sex with other men (MSM). °¨ You are a heterosexual man or woman and are sexually active with more than one partner. °¨ You take drugs by injection. °¨ You are sexually active with a partner who has HIV. °· Talk with your health care provider about whether you are at high risk of being infected with HIV. If you choose to begin PrEP, you should first be tested for HIV. You should then be tested every 3 months for as long as you are taking PrEP. °WHAT SHOULD I DO IF I THINK I HAVE AN STD? °· See your health care provider. °· Tell your sexual partner(s). They should be tested and treated for any STDs. °· Do not have sex until your health care provider says it is okay. °WHEN SHOULD I GET IMMEDIATE MEDICAL CARE? °Contact your health care provider right away if:  °· You have severe abdominal pain. °· You are a man and notice swelling or pain in your testicles. °· You are a woman and notice swelling or pain in your vagina. °  °This information is not intended to replace advice given to you by your health care provider. Make sure you discuss any questions you have with your health care provider. °  °Document Released: 08/31/2002 Document Revised: 07/01/2014 Document Reviewed: 12/29/2012 °Elsevier Interactive Patient Education ©2016 [employer] ° °

## 2015-07-17 NOTE — ED Notes (Signed)
Declined W/C at D/C and was escorted to lobby by RN. 

## 2015-07-18 LAB — GC/CHLAMYDIA PROBE AMP (~~LOC~~) NOT AT ARMC
Chlamydia: NEGATIVE
Neisseria Gonorrhea: NEGATIVE

## 2015-09-07 ENCOUNTER — Encounter (HOSPITAL_COMMUNITY): Payer: Self-pay | Admitting: *Deleted

## 2015-09-07 ENCOUNTER — Emergency Department (HOSPITAL_COMMUNITY)
Admission: EM | Admit: 2015-09-07 | Discharge: 2015-09-07 | Disposition: A | Discharge status: Home / Self Care | Payer: Self-pay | Attending: Emergency Medicine | Admitting: Emergency Medicine

## 2015-09-07 DIAGNOSIS — Y9389 Activity, other specified: Secondary | ICD-10-CM | POA: Insufficient documentation

## 2015-09-07 DIAGNOSIS — Y92410 Unspecified street and highway as the place of occurrence of the external cause: Secondary | ICD-10-CM | POA: Insufficient documentation

## 2015-09-07 DIAGNOSIS — F17210 Nicotine dependence, cigarettes, uncomplicated: Secondary | ICD-10-CM | POA: Insufficient documentation

## 2015-09-07 DIAGNOSIS — S6991XA Unspecified injury of right wrist, hand and finger(s), initial encounter: Secondary | ICD-10-CM | POA: Insufficient documentation

## 2015-09-07 DIAGNOSIS — Y998 Other external cause status: Secondary | ICD-10-CM | POA: Insufficient documentation

## 2015-09-07 DIAGNOSIS — S59911A Unspecified injury of right forearm, initial encounter: Secondary | ICD-10-CM | POA: Insufficient documentation

## 2015-09-07 MED ORDER — IBUPROFEN 800 MG PO TABS: 800.0000 mg | ORAL_TABLET | Freq: Three times a day (TID) | ORAL | Status: DC

## 2015-09-07 MED ORDER — IBUPROFEN 800 MG PO TABS
800.0000 mg | ORAL_TABLET | Freq: Once | ORAL | Status: AC
  Administered 2015-09-07: 800 mg via ORAL
  Filled 2015-09-07: qty 1

## 2015-09-07 NOTE — Discharge Instructions (Signed)
Take motrin 800 mg every 6 hrs for pain.  You are likely going to be stiff and sore all over for several days.   See your doctor   Return to ER if you have severe pain, unable to move your arm, headaches, vomiting, chest pain, abdominal pain.
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
## 2016-01-03 ENCOUNTER — Encounter (HOSPITAL_COMMUNITY): Payer: Self-pay | Admitting: Nurse Practitioner

## 2016-01-03 ENCOUNTER — Emergency Department (HOSPITAL_COMMUNITY)
Admission: EM | Admit: 2016-01-03 | Discharge: 2016-01-03 | Disposition: A | Discharge status: Home / Self Care | Payer: Self-pay | Attending: Emergency Medicine | Admitting: Emergency Medicine

## 2016-01-03 ENCOUNTER — Emergency Department (HOSPITAL_COMMUNITY): Payer: Self-pay

## 2016-01-03 DIAGNOSIS — R519 Headache, unspecified: Secondary | ICD-10-CM

## 2016-01-03 DIAGNOSIS — R51 Headache: Secondary | ICD-10-CM | POA: Insufficient documentation

## 2016-01-03 DIAGNOSIS — F17210 Nicotine dependence, cigarettes, uncomplicated: Secondary | ICD-10-CM | POA: Insufficient documentation

## 2016-01-03 DIAGNOSIS — E860 Dehydration: Secondary | ICD-10-CM | POA: Insufficient documentation

## 2016-01-03 LAB — CBC WITH DIFFERENTIAL/PLATELET
BASOS ABS: 0 10*3/uL (ref 0.0–0.1)
BASOS PCT: 0 %
Eosinophils Absolute: 0 10*3/uL (ref 0.0–0.7)
Eosinophils Relative: 1 %
HEMATOCRIT: 45.2 % (ref 39.0–52.0)
HEMOGLOBIN: 14.8 g/dL (ref 13.0–17.0)
Lymphocytes Relative: 22 %
Lymphs Abs: 1.2 10*3/uL (ref 0.7–4.0)
MCH: 28.7 pg (ref 26.0–34.0)
MCHC: 32.7 g/dL (ref 30.0–36.0)
MCV: 87.8 fL (ref 78.0–100.0)
MONOS PCT: 9 %
Monocytes Absolute: 0.5 10*3/uL (ref 0.1–1.0)
NEUTROS ABS: 3.6 10*3/uL (ref 1.7–7.7)
NEUTROS PCT: 68 %
Platelets: 146 10*3/uL — ABNORMAL LOW (ref 150–400)
RBC: 5.15 MIL/uL (ref 4.22–5.81)
RDW: 12.2 % (ref 11.5–15.5)
WBC: 5.3 10*3/uL (ref 4.0–10.5)

## 2016-01-03 LAB — BASIC METABOLIC PANEL
ANION GAP: 6 (ref 5–15)
BUN: 16 mg/dL (ref 6–20)
CHLORIDE: 99 mmol/L — AB (ref 101–111)
CO2: 28 mmol/L (ref 22–32)
Calcium: 8.9 mg/dL (ref 8.9–10.3)
Creatinine, Ser: 1.35 mg/dL — ABNORMAL HIGH (ref 0.61–1.24)
GFR calc non Af Amer: >60 mL/min (ref >60)
Glucose, Bld: 83 mg/dL (ref 65–99)
POTASSIUM: 3.9 mmol/L (ref 3.5–5.1)
SODIUM: 133 mmol/L — AB (ref 135–145)

## 2016-01-03 LAB — URINE MICROSCOPIC-ADD ON: RBC / HPF: NONE SEEN RBC/hpf (ref 0–5)

## 2016-01-03 LAB — I-STAT TROPONIN, ED: TROPONIN I, POC: 0 ng/mL (ref 0.00–0.08)

## 2016-01-03 LAB — URINALYSIS, ROUTINE W REFLEX MICROSCOPIC
Glucose, UA: NEGATIVE mg/dL
Hgb urine dipstick: NEGATIVE
KETONES UR: 15 mg/dL — AB
LEUKOCYTES UA: NEGATIVE
NITRITE: NEGATIVE
PH: 6.5 (ref 5.0–8.0)
PROTEIN: 30 mg/dL — AB
Specific Gravity, Urine: 1.036 — ABNORMAL HIGH (ref 1.005–1.030)

## 2016-01-03 MED ORDER — CEFTRIAXONE SODIUM 250 MG IJ SOLR
250.0000 mg | Freq: Once | INTRAMUSCULAR | Status: AC
  Administered 2016-01-03: 250 mg via INTRAMUSCULAR
  Filled 2016-01-03: qty 250

## 2016-01-03 MED ORDER — AZITHROMYCIN 250 MG PO TABS
1000.0000 mg | ORAL_TABLET | Freq: Once | ORAL | Status: AC
  Administered 2016-01-03: 1000 mg via ORAL
  Filled 2016-01-03: qty 4

## 2016-01-03 MED ORDER — SODIUM CHLORIDE 0.9 % IV BOLUS (SEPSIS)
1000.0000 mL | Freq: Once | INTRAVENOUS | Status: AC
  Administered 2016-01-03: 1000 mL via INTRAVENOUS

## 2016-01-03 MED ORDER — KETOROLAC TROMETHAMINE 30 MG/ML IJ SOLN
30.0000 mg | Freq: Once | INTRAMUSCULAR | Status: AC
  Administered 2016-01-03: 30 mg via INTRAVENOUS
  Filled 2016-01-03: qty 1

## 2016-01-03 MED ORDER — STERILE WATER FOR INJECTION IJ SOLN
INTRAMUSCULAR | Status: AC
  Administered 2016-01-03: 1 mL
  Filled 2016-01-03: qty 10

## 2016-01-03 NOTE — ED Provider Notes (Signed)
CSN: [id number]     Arrival date & time 01/03/16  1434 History   First MD Initiated Contact with Patient 01/03/16 1700     Chief Complaint  Patient presents with  . Fever     (Consider location/radiation/quality/duration/timing/severity/associated sxs/prior Treatment) HPI 25 year old male who presents with fever. Associated with intermittent headaches, chest discomfort, and aches in arms. Woke up in the morning these past 2 days with aching pain in the back of his head, improves at the end of the day. Symptoms occuring over two days. No documented temperature, feeling subjective fever and chills. No known sick contacts. No nausea or vomiting or diarrhea. No dysuria or frequency of urine. No sore throat, runny nose, congestion, cough, dyspnea. Taking dayquil and nyquil, with persistent symptoms. No travel. No appetite   History reviewed. No pertinent past medical history. History reviewed. No pertinent past surgical history. History reviewed. No pertinent family history. Social History  Substance Use Topics  . Smoking status: Current Every Day Smoker    Types: Cigarettes  . Smokeless tobacco: None  . Alcohol Use: Yes    Review of Systems 10/14 systems reviewed and are negative other than those stated in the HPI    Allergies  Review of patient's allergies indicates no known allergies.  Home Medications   Prior to Admission medications   Medication Sig Start Date End Date Taking? Authorizing Provider  clotrimazole (LOTRIMIN) 1 % cream Apply to affected areas 01/06/14   Roxy Horseman, PA-C  ibuprofen (ADVIL,MOTRIN) 800 MG tablet Take 1 tablet (800 mg total) by mouth 3 (three) times daily. 09/07/15   Charlynne Pander, MD   BP 125/70 mmHg  Pulse 63  Temp(Src) 99.7 F (37.6 C) (Oral)  Resp 20  Ht 6' (1.829 m)  Wt 155 lb (70.308 kg)  BMI 21.02 kg/m2  SpO2 100% Physical Exam Physical Exam  Nursing note and vitals reviewed. Constitutional: Well developed, well nourished,  non-toxic, and in no acute distress Head: Normocephalic and atraumatic.  Mouth/Throat: Oropharynx is clear and moist.  Neck: Normal range of motion. Neck supple. No meningismus.  Cardiovascular: Normal rate and regular rhythm.   Pulmonary/Chest: Effort normal and breath sounds normal.  Abdominal: Soft. There is no tenderness. There is no rebound and no guarding.  Musculoskeletal: Normal range of motion.  Neurological: Alert, no facial droop, fluent speech, moves all extremities symmetrically, PERRL, EOMI Skin: Skin is warm and dry.  Psychiatric: Cooperative  ED Course  Procedures (including critical care time) Labs Review Labs Reviewed  URINALYSIS, ROUTINE W REFLEX MICROSCOPIC (NOT AT [facility]) - Abnormal; Notable for the following:    Color, Urine AMBER (*)    Specific Gravity, Urine 1.036 (*)    Bilirubin Urine SMALL (*)    Ketones, ur 15 (*)    Protein, ur 30 (*)    All other components within normal limits  CBC WITH DIFFERENTIAL/PLATELET - Abnormal; Notable for the following:    Platelets 146 (*)    All other components within normal limits  BASIC METABOLIC PANEL - Abnormal; Notable for the following:    Sodium 133 (*)    Chloride 99 (*)    Creatinine, Ser 1.35 (*)    All other components within normal limits  URINE MICROSCOPIC-ADD ON - Abnormal; Notable for the following:    Squamous Epithelial / LPF 0-5 (*)    Bacteria, UA FEW (*)    All other components within normal limits  HIV ANTIBODY (ROUTINE TESTING)  RPR  I-STAT TROPOININ,  ED  GC/CHLAMYDIA PROBE AMP ([place]) NOT [facility]    Imaging Review Dg Chest 2 View  01/03/2016  CLINICAL DATA:  25 year old male with chest tightness and fever EXAM: CHEST  2 VIEW COMPARISON:  None. FINDINGS: The heart size and mediastinal contours are within normal limits. Both lungs are clear. The visualized skeletal structures are unremarkable. IMPRESSION: No active cardiopulmonary disease. Electronically Signed   By: Elgie CollardArash  Radparvar M.D.    On: 01/03/2016 18:36   I have personally reviewed and evaluated these images and lab results as part of my medical decision-making.   EKG Interpretation   Date/Time:  Wednesday January 03 2016 18:05:46 EDT Ventricular Rate:  64 PR Interval:    QRS Duration: 106 QT Interval:  388 QTC Calculation: 401 R Axis:   77 Text Interpretation:  Sinus rhythm RSR' in V1 or V2, probably normal  variant ST elev, probable normal early repol pattern Likely benign early  repolarization  Confirmed by Tamantha Saline MD, Melvena Vink [phone]) on 01/03/2016 6:46:36 PM      MDM   Final diagnoses:  Dehydration  Nonintractable episodic headache, unspecified headache type    25 year old male with 2 days of intermittent headache, decreased appetite, subjective fever and chills. Afebrile with normal VS on arrival. He is well-appearing in no acute distress. Neuro exam intact, no meningismus. His presentation is benign and does not seem consistent with that of meningitis. No other signs or symptoms of systemic illness. Blood work reveals unremarkable CBC. Does have mild acute kidney injury and looks dehydrated on blood work and urine. Symptoms improved after a dose of Toradol with IV fluids. Chest x-ray without infiltrate. He subsequently states that he is concerned about STD, and given recent exposure. States that with urination today he felt slight burning. Empirically treated for gonorrhea and chlamydia. GC chlamydia was obtained. I will also obtain HIV and RPR screening tests given some of his vague symptoms as well. At this time i feel he is stable for discharge. Strict return and follow-up instructions reviewed. He expressed understanding of all discharge instructions and felt comfortable with the plan of care.     Lavera Guiseana Duo Donnis Phaneuf, MD 01/03/16 [phone]

## 2016-01-03 NOTE — ED Notes (Signed)
Pt c/o 2 day history of fevers and headaches. He denies cough, sob, cp, congestion, runny nose, sore throat. He tried dayquil and nyquil with no relief. He is alert and breathing easily

## 2016-01-03 NOTE — Discharge Instructions (Signed)
Continue to drink plenty of fluids and take over-the counter medications for your headaches as needed. You were treated for potential STD with antibiotics, and we will call you if your testing comes back positive. Return for worsening symptoms, including escalating pain, unable to move your neck, confusion, vomiting and unable to keep down food/fluids, persistent fevers, or any other symptoms concerning to you.  General Headache Without Cause A headache is pain or discomfort felt around the head or neck area. The specific cause of a headache may not be found. There are many causes and types of headaches. A few common ones are:  Tension headaches.  Migraine headaches.  Cluster headaches.  Chronic daily headaches. HOME CARE INSTRUCTIONS  Watch your condition for any changes. Take these steps to help with your condition: Managing Pain  Take over-the-counter and prescription medicines only as told by your health care provider.  Lie down in a dark, quiet room when you have a headache.  If directed, apply ice to the head and neck area:  Put ice in a plastic bag.  Place a towel between your skin and the bag.  Leave the ice on for 20 minutes, 2-3 times per day.  Use a heating pad or hot shower to apply heat to the head and neck area as told by your health care provider.  Keep lights dim if bright lights bother you or make your headaches worse. Eating and Drinking  Eat meals on a regular schedule.  Limit alcohol use.  Decrease the amount of caffeine you drink, or stop drinking caffeine. General Instructions  Keep all follow-up visits as told by your health care provider. This is important.  Keep a headache journal to help find out what may trigger your headaches. For example, write down:  What you eat and drink.  How much sleep you get.  Any change to your diet or medicines.  Try massage or other relaxation techniques.  Limit stress.  Sit up straight, and do not tense  your muscles.  Do not use tobacco products, including cigarettes, chewing tobacco, or e-cigarettes. If you need help quitting, ask your health care provider.  Exercise regularly as told by your health care provider.  Sleep on a regular schedule. Get 7-9 hours of sleep, or the amount recommended by your health care provider. SEEK MEDICAL CARE IF:   Your symptoms are not helped by medicine.  You have a headache that is different from the usual headache.  You have nausea or you vomit.  You have a fever. SEEK IMMEDIATE MEDICAL CARE IF:   Your headache becomes severe.  You have repeated vomiting.  You have a stiff neck.  You have a loss of vision.  You have problems with speech.  You have pain in the eye or ear.  You have muscular weakness or loss of muscle control.  You lose your balance or have trouble walking.  You feel faint or pass out.  You have confusion.   This information is not intended to replace advice given to you by your health care provider. Make sure you discuss any questions you have with your health care provider.   Document Released: 06/10/2005 Document Revised: 03/01/2015 Document Reviewed: 10/03/2014 Elsevier Interactive Patient Education [employer]2016 [employer]vier Inc.

## 2016-01-04 LAB — RPR: RPR Ser Ql: NONREACTIVE

## 2016-01-04 LAB — GC/CHLAMYDIA PROBE AMP (~~LOC~~) NOT AT ARMC
CHLAMYDIA, DNA PROBE: NEGATIVE
NEISSERIA GONORRHEA: NEGATIVE

## 2016-01-04 LAB — HIV ANTIBODY (ROUTINE TESTING W REFLEX): HIV Screen 4th Generation wRfx: NONREACTIVE

## 2016-01-08 ENCOUNTER — Emergency Department (HOSPITAL_COMMUNITY)
Admission: EM | Admit: 2016-01-08 | Discharge: 2016-01-08 | Disposition: A | Discharge status: Home / Self Care | Payer: No Typology Code available for payment source | Attending: Emergency Medicine | Admitting: Emergency Medicine

## 2016-01-08 ENCOUNTER — Encounter (HOSPITAL_COMMUNITY): Payer: Self-pay | Admitting: Oncology

## 2016-01-08 DIAGNOSIS — R202 Paresthesia of skin: Secondary | ICD-10-CM | POA: Insufficient documentation

## 2016-01-08 DIAGNOSIS — R300 Dysuria: Secondary | ICD-10-CM | POA: Insufficient documentation

## 2016-01-08 DIAGNOSIS — F17210 Nicotine dependence, cigarettes, uncomplicated: Secondary | ICD-10-CM | POA: Insufficient documentation

## 2016-01-08 NOTE — ED Notes (Signed)
Pt states that he was at the hospital on Thursday and had burning w/ urination.  Denies any burning or other sx after that.  Does endorse a tingling feeling in his penis.  His significant other is here to be checked as well.  States they do not use condoms during intercourse.  Pt also reports unprotected sex w/ another woman approximately 1 year ago.

## 2016-01-08 NOTE — ED Notes (Signed)
Informed by other staff that pt ambulated out of department.

## 2016-01-08 NOTE — ED Provider Notes (Signed)
CSN: [id number]     Arrival date & time 01/08/16  0139 History  By signing my name below, I, Bethel Born, attest that this documentation has been prepared under the direction and in the presence of Shon Baton, MD. Electronically Signed: Bethel Born, ED Scribe. 01/08/2016. 2:34 AM   Chief Complaint  Patient presents with  . SEXUALLY TRANSMITTED DISEASE    The history is provided by the patient. No language interpreter was used.   Randy Wagner is a 25 y.o. male who presents to the Emergency Department complaining of penile tingling and dysuria with onset 5 days ago. Pt was seen 5 days ago at [facility] where he was tested and empirically treated for STDs. He states that his symptoms have persisted.  Pt denies fever and penile discharge. He has not had any new sexual partners since being treated for STDs 5 days ago.    History reviewed. No pertinent past medical history. History reviewed. No pertinent past surgical history. No family history on file. Social History  Substance Use Topics  . Smoking status: Current Every Day Smoker    Types: Cigarettes  . Smokeless tobacco: Never Used  . Alcohol Use: Yes    Review of Systems  Constitutional: Negative for fever.  Genitourinary: Positive for dysuria. Negative for discharge.  All other systems reviewed and are negative.   Allergies  Review of patient's allergies indicates no known allergies.  Home Medications   Prior to Admission medications   Medication Sig Start Date End Date Taking? Authorizing Provider  clotrimazole (LOTRIMIN) 1 % cream Apply to affected areas 01/06/14   Roxy Horseman, PA-C  ibuprofen (ADVIL,MOTRIN) 800 MG tablet Take 1 tablet (800 mg total) by mouth 3 (three) times daily. 09/07/15   Charlynne Pander, MD   BP 110/70 mmHg  Pulse 60  Temp(Src) 97.7 F (36.5 C) (Oral)  Resp 12  Ht 6' (1.829 m)  Wt 155 lb (70.308 kg)  BMI 21.02 kg/m2  SpO2 100% Physical Exam  Constitutional: He is oriented  to person, place, and time. He appears well-developed and well-nourished. No distress.  HENT:  Head: Normocephalic and atraumatic.  Cardiovascular: Normal rate and regular rhythm.   Pulmonary/Chest: Effort normal. No respiratory distress.  Neurological: He is alert and oriented to person, place, and time.  Skin: Skin is warm and dry.  Psychiatric: He has a normal mood and affect.  Nursing note and vitals reviewed.   ED Course  Procedures (including critical care time) DIAGNOSTIC STUDIES: Oxygen Saturation is 100% on RA,  normal by my interpretation.    COORDINATION OF CARE: 2:32 AM Discussed treatment plan which includes UA with pt at bedside and pt agreed to plan.  Labs Review Labs Reviewed  URINALYSIS, ROUTINE W REFLEX MICROSCOPIC (NOT AT [facility])    Imaging Review No results found. I have personally reviewed and evaluated these lab results as part of my medical decision-making.   EKG Interpretation None      MDM   Final diagnoses:  Dysuria    She presents with persistent dysuria. Was seen and evaluated 5 days ago. Had negative STD testing at that time. Was empirically treated. Will repeat urinalysis.  Patient left prior to repeat testing.  After history, exam, and medical workup I feel the patient has been appropriately medically screened and is safe for discharge home. Pertinent diagnoses were discussed with the patient. Patient was given return precautions.  I personally performed the services described in this documentation, which was scribed  in my presence. The recorded information has been reviewed and is accurate.    Shon Batonourtney F Jacqueline Delapena, MD 01/08/16 [phone]

## 2016-01-08 NOTE — ED Notes (Addendum)
Dr. Wilkie AyeHorton made aware that pt has left.

## 2016-02-09 ENCOUNTER — Encounter (HOSPITAL_COMMUNITY): Payer: Self-pay | Admitting: Emergency Medicine

## 2016-02-09 ENCOUNTER — Emergency Department (HOSPITAL_COMMUNITY)
Admission: EM | Admit: 2016-02-09 | Discharge: 2016-02-09 | Disposition: A | Discharge status: Home / Self Care | Payer: Self-pay | Attending: Emergency Medicine | Admitting: Emergency Medicine

## 2016-02-09 ENCOUNTER — Emergency Department (HOSPITAL_COMMUNITY): Payer: Self-pay

## 2016-02-09 DIAGNOSIS — F17210 Nicotine dependence, cigarettes, uncomplicated: Secondary | ICD-10-CM | POA: Insufficient documentation

## 2016-02-09 DIAGNOSIS — J029 Acute pharyngitis, unspecified: Secondary | ICD-10-CM | POA: Insufficient documentation

## 2016-02-09 DIAGNOSIS — R0789 Other chest pain: Secondary | ICD-10-CM | POA: Insufficient documentation

## 2016-02-09 LAB — BASIC METABOLIC PANEL
ANION GAP: 5 (ref 5–15)
BUN: 16 mg/dL (ref 6–20)
CALCIUM: 9.7 mg/dL (ref 8.9–10.3)
CO2: 28 mmol/L (ref 22–32)
Chloride: 108 mmol/L (ref 101–111)
Creatinine, Ser: 1.33 mg/dL — ABNORMAL HIGH (ref 0.61–1.24)
GLUCOSE: 82 mg/dL (ref 65–99)
Potassium: 3.8 mmol/L (ref 3.5–5.1)
SODIUM: 141 mmol/L (ref 135–145)

## 2016-02-09 LAB — CBC
HCT: 41.8 % (ref 39.0–52.0)
HEMOGLOBIN: 13.6 g/dL (ref 13.0–17.0)
MCH: 28.5 pg (ref 26.0–34.0)
MCHC: 32.5 g/dL (ref 30.0–36.0)
MCV: 87.6 fL (ref 78.0–100.0)
Platelets: 296 10*3/uL (ref 150–400)
RBC: 4.77 MIL/uL (ref 4.22–5.81)
RDW: 13.4 % (ref 11.5–15.5)
WBC: 7.3 10*3/uL (ref 4.0–10.5)

## 2016-02-09 LAB — I-STAT TROPONIN, ED
TROPONIN I, POC: 0.01 ng/mL (ref 0.00–0.08)
Troponin i, poc: 0 ng/mL (ref 0.00–0.08)

## 2016-02-09 MED ORDER — RANITIDINE HCL 150 MG PO CAPS: 150.0000 mg | ORAL_CAPSULE | Freq: Every day | ORAL | 0 refills | Status: DC

## 2016-02-09 MED ORDER — GI COCKTAIL ~~LOC~~
30.0000 mL | Freq: Once | ORAL | Status: AC
  Administered 2016-02-09: 30 mL via ORAL
  Filled 2016-02-09: qty 30

## 2016-02-09 MED ORDER — MORPHINE SULFATE (PF) 4 MG/ML IV SOLN: 4.0000 mg | Freq: Once | INTRAVENOUS | Status: DC

## 2016-02-09 MED ORDER — MORPHINE SULFATE (PF) 4 MG/ML IV SOLN
4.0000 mg | Freq: Once | INTRAVENOUS | Status: AC
Start: 2016-02-09 — End: 2016-02-09
  Administered 2016-02-09: 4 mg via INTRAVENOUS
  Filled 2016-02-09: qty 1

## 2016-02-09 MED ORDER — OMEPRAZOLE 20 MG PO CPDR: 20.0000 mg | DELAYED_RELEASE_CAPSULE | Freq: Two times a day (BID) | ORAL | 0 refills | Status: DC

## 2016-02-09 NOTE — ED Notes (Signed)
Returned from x ray and placed on heart monitor 

## 2016-02-09 NOTE — ED Triage Notes (Signed)
cp x 3 hours ago hurts to swollow he states   No n/v some sob

## 2016-02-09 NOTE — ED Provider Notes (Signed)
[facility]-EMERGENCY DEPT Provider Note   CSN: [id number] Arrival date & time: 02/09/16  1144     History   Chief Complaint Chief Complaint  Patient presents with  . Chest Pain    HPI Randy Wagner is a 25 y.o. male.  HPI   25 year old male without any significant past medical history presenting for evaluation of chest pain. Patient reports gradual onset of midsternal chest pain that started earlier this morning and has persistent since. Patient will report pain as a sharp sensation to his chest with a burning sensation when he swallows. Pain is intensified with swallowing. Rates pain a 7 out of 10, nonradiating. Denies any associated lightheadedness, dizziness, diaphoresis, arm pain, or exertional chest pain. Pain started shortly after he smoked a cigarette. Has never had this kind of pain before. Does not have any significant cardiac history. Denies any family history of premature cardiac death. Denies any prior history of PE or DVT. Denies history of GERD. Also denies having fever, chills, abdominal pain, back pain, nausea vomiting diarrhea. No heavy lifting or strenuous activity.  History reviewed. No pertinent past medical history.  There are no active problems to display for this patient.   History reviewed. No pertinent surgical history.     Home Medications    Prior to Admission medications   Medication Sig Start Date End Date Taking? Authorizing Provider  clotrimazole (LOTRIMIN) 1 % cream Apply to affected areas 01/06/14   Roxy Horsemanobert Browning, PA-C  ibuprofen (ADVIL,MOTRIN) 800 MG tablet Take 1 tablet (800 mg total) by mouth 3 (three) times daily. 09/07/15   Charlynne Panderavid Hsienta Yao, MD    Family History No family history on file.  Social History Social History  Substance Use Topics  . Smoking status: Current Every Day Smoker    Types: Cigarettes  . Smokeless tobacco: Never Used  . Alcohol use Yes     Allergies   Review of patient's allergies indicates no known  allergies.   Review of Systems Review of Systems  All other systems reviewed and are negative.    Physical Exam Updated Vital Signs BP 130/75 (BP Location: Left Arm)   Pulse 85   Temp 98.5 F (36.9 C) (Oral)   Resp 16   Ht 6' (1.829 m)   Wt 72.6 kg   SpO2 100%   BMI 21.70 kg/m   Physical Exam  Constitutional: He appears well-developed and well-nourished. No distress.  HENT:  Head: Atraumatic.  Mouth/Throat: Oropharynx is clear and moist. No oropharyngeal exudate.  Eyes: Conjunctivae are normal.  Neck: Neck supple.  Cardiovascular: Normal rate, regular rhythm and intact distal pulses.   Pulmonary/Chest: Effort normal and breath sounds normal. He exhibits no tenderness.  Abdominal: Soft. He exhibits no distension. There is no tenderness.  Musculoskeletal: He exhibits no edema.  Neurological: He is alert.  Skin: Capillary refill takes less than 2 seconds. No rash noted.  Psychiatric: He has a normal mood and affect.  Nursing note and vitals reviewed.    ED Treatments / Results  Labs (all labs ordered are listed, but only abnormal results are displayed) Labs Reviewed  BASIC METABOLIC PANEL - Abnormal; Notable for the following:       Result Value   Creatinine, Ser 1.33 (*)    All other components within normal limits  CBC  I-STAT TROPOININ, ED  Rosezena SensorI-STAT TROPOININ, ED    EKG  EKG Interpretation  Date/Time:  Friday February 09 2016 11:47:45 EDT Ventricular Rate:  85 PR Interval:  156  QRS Duration: 94 QT Interval:  364 QTC Calculation: 433 R Axis:   88 Text Interpretation:  Normal sinus rhythm Normal ECG inferior and lateral q waves - new, but no associated ST changes Confirmed by Rhunette CroftNANAVATI, MD, Janey GentaANKIT [phone]([id number]) on 02/09/2016 12:30:17 PM       Radiology Dg Chest 2 View  Result Date: 02/09/2016 CLINICAL DATA:  Mid chest pain today after swallowing, smoker EXAM: CHEST  2 VIEW COMPARISON:  01/03/2016 FINDINGS: Normal heart size, mediastinal contours, and pulmonary  vascularity. EKG leads project over the upper lobes. Lungs clear. No pleural effusion or pneumothorax. Minimal levoconvex scoliosis upper thoracic spine. IMPRESSION: No acute abnormalities. Electronically Signed   By: Ulyses SouthwardMark  Boles M.D.   On: 02/09/2016 12:39    Procedures Procedures (including critical care time)  Medications Ordered in ED Medications  gi cocktail (Maalox,Lidocaine,Donnatal) (30 mLs Oral Given 02/09/16 1300)  morphine 4 MG/ML injection 4 mg (4 mg Intravenous Given 02/09/16 1512)     Initial Impression / Assessment and Plan / ED Course  I have reviewed the triage vital signs and the nursing notes.  Pertinent labs & imaging results that were available during my care of the patient were reviewed by me and considered in my medical decision making (see chart for details).  Clinical Course    BP 123/76   Pulse (!) 55   Temp 98.5 F (36.9 C) (Oral)   Resp 17   Ht 6' (1.829 m)   Wt 72.6 kg   SpO2 100%   BMI 21.70 kg/m    Final Clinical Impressions(s) / ED Diagnoses   Final diagnoses:  Other chest pain  Sore throat    New Prescriptions New Prescriptions   OMEPRAZOLE (PRILOSEC) 20 MG CAPSULE    Take 1 capsule (20 mg total) by mouth 2 (two) times daily before a meal.   RANITIDINE (ZANTAC) 150 MG CAPSULE    Take 1 capsule (150 mg total) by mouth daily.   12:43 PM Pt presents with midsternal cp worsening with swallowing.  Pain is atypical for ACS.  No SOB and no significant risk factors for PE. PERC negative. Will treat with GI cocktail, work up initiated.    3:25 PM HEART score of 2, Low risk of MACE.  Currently awaits delta trop.   4:05 PM Normal delta trop.  Since pt has midsternal chest discomfort with trouble swallowing, but normal throat exam, plan to provide PPI and H2 for suspect GERD causing sxs.  Recommend f/u with PCP for further care.  Return precaution discussed.  Pt stable for discharge.    Fayrene HelperBowie Sabra Sessler, PA-C 02/09/16 1609    Derwood KaplanAnkit Nanavati,  MD 02/10/16 1428

## 2016-02-09 NOTE — Discharge Instructions (Signed)
Please take prilosec and zantac as prescribed as it may help with your chest discomfort.  Use number below to find a primary care provider for further management of your health.  Return if your condition worsen or if you have other concerns.

## 2016-09-02 ENCOUNTER — Emergency Department (HOSPITAL_COMMUNITY): Payer: Self-pay

## 2016-09-02 ENCOUNTER — Emergency Department (HOSPITAL_COMMUNITY)
Admission: EM | Admit: 2016-09-02 | Discharge: 2016-09-02 | Disposition: A | Discharge status: Home / Self Care | Payer: Self-pay | Attending: Emergency Medicine | Admitting: Emergency Medicine

## 2016-09-02 ENCOUNTER — Encounter (HOSPITAL_COMMUNITY): Payer: Self-pay | Admitting: *Deleted

## 2016-09-02 DIAGNOSIS — R52 Pain, unspecified: Secondary | ICD-10-CM

## 2016-09-02 DIAGNOSIS — M546 Pain in thoracic spine: Secondary | ICD-10-CM | POA: Insufficient documentation

## 2016-09-02 DIAGNOSIS — F17210 Nicotine dependence, cigarettes, uncomplicated: Secondary | ICD-10-CM | POA: Insufficient documentation

## 2016-09-02 DIAGNOSIS — Z79899 Other long term (current) drug therapy: Secondary | ICD-10-CM | POA: Insufficient documentation

## 2016-09-02 MED ORDER — DICLOFENAC SODIUM 50 MG PO TBEC: 50.0000 mg | DELAYED_RELEASE_TABLET | Freq: Two times a day (BID) | ORAL | 0 refills | Status: DC

## 2016-09-02 NOTE — ED Triage Notes (Signed)
The pt is c/o his bones popping all over his body when he moves for two months  espicially in his lower back ribs and chest  No knolwn temp

## 2016-09-02 NOTE — ED Notes (Signed)
Pt stable, understands discharge instructions, and reasons for return.   

## 2016-09-02 NOTE — ED Notes (Signed)
Pt ambulatory to room without difficulty. C/o "bones cracking when walking and bending over."

## 2016-09-02 NOTE — ED Provider Notes (Signed)
[facility]-EMERGENCY DEPT Provider Note   CSN: [id number] Arrival date & time: 09/02/16  1549  By signing my name below, I, Linna Darner, attest that this documentation has been prepared under the direction and in the presence of non-physician practitioner, [facility] M. Damian Leavell, NP. Electronically Signed: Linna Darner, Scribe. 09/02/2016. 5:13 PM.  History   Chief Complaint Chief Complaint  Patient presents with  . Back Pain    The history is provided by the patient. No language interpreter was used.  Back Pain   This is a new problem. The current episode started more than 1 week ago. The problem occurs constantly. The problem has been gradually worsening. The pain is associated with no known injury. The pain is present in the thoracic spine. The pain does not radiate. The pain is moderate. The pain is the same all the time. Associated symptoms include chest pain (with cough). Pertinent negatives include no fever, no numbness, no paresthesias, no tingling and no weakness.     HPI Comments: Randy Wagner is a 26 y.o. male who presents to the Emergency Department complaining of gradually worsening upper back pain for about two months. He also notes some intermittent sharp pains in his bilateral knees and wrists during this time span. Pt reports an occasional discomfort in his chest as well that is worse with respiration and his recent persistent cough. No alleviating factors noted. 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 He denies recent over-exertion, heavy lifting, or frequent bending. No known trauma or injury to his back. He states he smokes ~ 2 cigarettes daily. Pt denies nausea, vomiting, numbness, weakness, fever, chills, or any other associated symptoms. Patient states he is afraid he has lung cancer.  History reviewed. No pertinent past medical history.  There are no active problems to display for  this patient.   History reviewed. No pertinent surgical history.     Home Medications    Prior to Admission medications   Medication Sig Start Date End Date Taking? Authorizing Provider  diclofenac (VOLTAREN) 50 MG EC tablet Take 1 tablet (50 mg total) by mouth 2 (two) times daily. 09/02/16   Darvell Monteforte Orlene Och, NP  omeprazole (PRILOSEC) 20 MG capsule Take 1 capsule (20 mg total) by mouth 2 (two) times daily before a meal. 02/09/16   Fayrene Helper, PA-C  ranitidine (ZANTAC) 150 MG capsule Take 1 capsule (150 mg total) by mouth daily. 02/09/16   Fayrene Helper, PA-C    Family History No family history on file.  Social History Social History  Substance Use Topics  . Smoking status: Current Every Day Smoker    Types: Cigarettes  . Smokeless tobacco: Never Used  . Alcohol use Yes     Allergies   Patient has no known allergies.   Review of Systems Review of Systems  Constitutional: Negative for chills and fever.  HENT: Negative for congestion, ear pain and sore throat.   Respiratory: Positive for cough.   Cardiovascular: Positive for chest pain (with cough).  Gastrointestinal: Negative for nausea and vomiting.  Musculoskeletal: Positive for arthralgias and back pain.  Skin: Negative for rash.  Neurological: Negative for tingling, weakness, numbness and paresthesias.    Physical Exam Updated Vital Signs BP (!) 114/52 (BP Location: Right Arm)   Pulse (!) 56   Temp 98 F (36.7 C) (Oral)   Resp 16   Ht 6' (1.829 m)   [employer]  77.8 kg   SpO2 100%   BMI 23.26 kg/m   Physical Exam  Constitutional: He is oriented to person, place, and time. He appears well-developed and well-nourished. No distress.  HENT:  Head: Normocephalic and atraumatic.  Right Ear: Tympanic membrane normal.  Left Ear: Tympanic membrane normal.  Nose: Nose normal.  Mouth/Throat: Uvula is midline and mucous membranes are normal. No posterior oropharyngeal edema or posterior oropharyngeal erythema.  Eyes:  Conjunctivae and EOM are normal. Pupils are equal, round, and reactive to light. No scleral icterus.  Neck: Normal range of motion. Neck supple. No Brudzinski's sign and no Kernig's sign noted.  Cardiovascular: Normal rate and regular rhythm.   Pulmonary/Chest: Effort normal and breath sounds normal. No respiratory distress.  Lungs are clear and equal bilaterally.  Abdominal: Soft. There is no tenderness.  Musculoskeletal: Normal range of motion.  Radial pulses 2+. Adequate circulation.  Lymphadenopathy:    He has no cervical adenopathy.  Neurological: He is alert and oriented to person, place, and time. He has normal strength and normal reflexes. He displays a negative Romberg sign.  Grip strengths equal bilaterally. Straight leg raises without difficulty. Ambulatory with steady gait, no foot drag. Stands on one foot without difficulty.  Skin: Skin is warm and dry.  Psychiatric: He has a normal mood and affect. His behavior is normal.  Nursing note and vitals reviewed.   ED Treatments / Results  Labs (all labs ordered are listed, but only abnormal results are displayed) Labs Reviewed - No data to display  Radiology Dg Chest 2 View  Result Date: 09/02/2016 CLINICAL DATA:  Dry cough, mid chest discomfort and back pain between the shoulder blades x 2 months. Sometimes left side of neck "feels tight." 2 episodes of SOB. EXAM: CHEST  2 VIEW COMPARISON:  02/09/2016 FINDINGS: There is 16 degrees of levoconvex upper thoracic scoliosis as measured between T1 and T5. The lungs appear clear. Cardiac and mediastinal contours normal. No pleural effusion identified. IMPRESSION: 1.  No active cardiopulmonary disease is radiographically apparent. 2. 16 degrees of levoconvex upper thoracic scoliosis measured between T1 and T5. Electronically Signed   By: Gaylyn Rong M.D.   On: 09/02/2016 17:54    Procedures Procedures (including critical care time)  DIAGNOSTIC STUDIES: Oxygen Saturation is  100% on RA, normal by my interpretation.    COORDINATION OF CARE: 5:19 PM Discussed treatment plan with pt at bedside and pt agreed to plan.  Medications Ordered in ED Medications - No data to display   Initial Impression / Assessment and Plan / ED Course  I have reviewed the triage vital signs and the nursing notes.  Pertinent imaging results that were available during my care of the patient were reviewed by me and considered in my medical decision making (see chart for details).   Final Clinical Impressions(s) / ED Diagnoses  26 y.o. male with back pain and body aches stable for d/c with normal neuro exam and no acute findings on CXR. Will treat for muscle strain. Discussed with the patient and all questioned fully answered. He will return  if any problems arise.   Final diagnoses:  Body aches    New Prescriptions Discharge Medication List as of 09/02/2016  6:13 PM    START taking these medications   Details  diclofenac (VOLTAREN) 50 MG EC tablet Take 1 tablet (50 mg total) by mouth 2 (two) times daily., Starting Mon 09/02/2016, Print      I personally performed the services described in  this documentation, which was scribed in my presence. The recorded information has been reviewed and is accurate.    [place] HeightsHope M Jezebel Pollet, NP 09/04/16 1059    Mancel BaleElliott Wentz, MD 09/05/16 1057

## 2017-03-22 ENCOUNTER — Emergency Department (HOSPITAL_COMMUNITY): Payer: No Typology Code available for payment source

## 2017-03-22 ENCOUNTER — Emergency Department (HOSPITAL_COMMUNITY)
Admission: EM | Admit: 2017-03-22 | Discharge: 2017-03-22 | Disposition: A | Discharge status: Home / Self Care | Payer: No Typology Code available for payment source | Attending: Emergency Medicine | Admitting: Emergency Medicine

## 2017-03-22 DIAGNOSIS — Y92411 Interstate highway as the place of occurrence of the external cause: Secondary | ICD-10-CM | POA: Diagnosis not present

## 2017-03-22 DIAGNOSIS — Y9389 Activity, other specified: Secondary | ICD-10-CM | POA: Diagnosis not present

## 2017-03-22 DIAGNOSIS — Y999 Unspecified external cause status: Secondary | ICD-10-CM | POA: Insufficient documentation

## 2017-03-22 DIAGNOSIS — F17210 Nicotine dependence, cigarettes, uncomplicated: Secondary | ICD-10-CM | POA: Diagnosis not present

## 2017-03-22 DIAGNOSIS — S60221A Contusion of right hand, initial encounter: Secondary | ICD-10-CM | POA: Diagnosis not present

## 2017-03-22 DIAGNOSIS — S060X0A Concussion without loss of consciousness, initial encounter: Secondary | ICD-10-CM | POA: Insufficient documentation

## 2017-03-22 DIAGNOSIS — S0990XA Unspecified injury of head, initial encounter: Secondary | ICD-10-CM | POA: Diagnosis present

## 2017-03-22 MED ORDER — IBUPROFEN 600 MG PO TABS: 600.0000 mg | ORAL_TABLET | Freq: Three times a day (TID) | ORAL | 0 refills | Status: DC | PRN

## 2017-03-22 MED ORDER — ACETAMINOPHEN 500 MG PO TABS
1000.0000 mg | ORAL_TABLET | Freq: Once | ORAL | Status: DC
  Filled 2017-03-22: qty 2
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
## 2017-03-22 NOTE — ED Notes (Addendum)
Note made in error

## 2017-06-13 ENCOUNTER — Ambulatory Visit (HOSPITAL_COMMUNITY)
Admission: RE | Admit: 2017-06-13 | Discharge: 2017-06-13 | Disposition: A | Discharge status: Home / Self Care | Payer: Self-pay | Source: Ambulatory Visit | Attending: Maternal and Fetal Medicine | Admitting: Maternal and Fetal Medicine

## 2017-06-13 DIAGNOSIS — Z31448 Encounter for other genetic testing of male for procreative management: Secondary | ICD-10-CM | POA: Insufficient documentation

## 2017-06-13 NOTE — Progress Notes (Signed)
[facility]  Pt tolerated well.

## 2017-06-23 ENCOUNTER — Other Ambulatory Visit (HOSPITAL_COMMUNITY): Payer: Self-pay

## 2017-07-02 ENCOUNTER — Other Ambulatory Visit (HOSPITAL_COMMUNITY): Payer: Self-pay

## 2017-07-03 ENCOUNTER — Other Ambulatory Visit (HOSPITAL_COMMUNITY): Payer: Self-pay

## 2018-09-07 ENCOUNTER — Emergency Department (HOSPITAL_COMMUNITY)
Admission: EM | Admit: 2018-09-07 | Discharge: 2018-09-07 | Disposition: A | Discharge status: Home / Self Care | Payer: Self-pay

## 2018-09-07 ENCOUNTER — Other Ambulatory Visit: Payer: Self-pay

## 2018-09-07 NOTE — ED Notes (Signed)
Attempted to pull pt to a room, pt gave this RN a "1 moment" gesture and stated he was on the phone. Advised pt his room was ready, pt continued to walk around on phone.
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
## 2018-09-07 NOTE — ED Notes (Signed)
No answer x2 

## 2018-09-07 NOTE — ED Notes (Signed)
No answer x 3. Pt removed from census

## 2019-08-03 ENCOUNTER — Other Ambulatory Visit: Payer: Self-pay

## 2019-08-03 ENCOUNTER — Emergency Department (HOSPITAL_COMMUNITY)
Admission: EM | Admit: 2019-08-03 | Discharge: 2019-08-03 | Disposition: A | Discharge status: Home / Self Care | Payer: Self-pay | Attending: Emergency Medicine | Admitting: Emergency Medicine

## 2019-08-03 DIAGNOSIS — K029 Dental caries, unspecified: Secondary | ICD-10-CM | POA: Insufficient documentation

## 2019-08-03 DIAGNOSIS — K047 Periapical abscess without sinus: Secondary | ICD-10-CM | POA: Insufficient documentation

## 2019-08-03 MED ORDER — TRAMADOL HCL 50 MG PO TABS: 50.0000 mg | ORAL_TABLET | Freq: Four times a day (QID) | ORAL | 0 refills | Status: AC | PRN

## 2019-08-03 MED ORDER — PENICILLIN V POTASSIUM 500 MG PO TABS: 500.0000 mg | ORAL_TABLET | Freq: Four times a day (QID) | ORAL | 0 refills | Status: AC

## 2019-08-03 MED ORDER — IBUPROFEN 800 MG PO TABS: 800.0000 mg | ORAL_TABLET | Freq: Four times a day (QID) | ORAL | 0 refills | Status: AC | PRN

## 2019-08-03 NOTE — ED Provider Notes (Signed)
[facility] EMERGENCY DEPARTMENT Provider Note   CSN: [id number] Arrival date & time: 08/03/19  0901     History Chief Complaint  Patient presents with  . Dental Pain    Randy Wagner is a 29 y.o. male.  HPI Patient presents to the emergency department with dental pain involving the right upper first molar.  The patient states it has been broken and cracked for quite a while.  Patient states this pain started 2 days ago.  The patient states that he has spoken with the dentist but does not have one.  The patient states that he did take some Tylenol at home with no relief.  The patient states that nothing seems make the condition better air and hot and cold make the pain worse.  Patient has throat swelling, neck swelling, mouth swelling, weakness, dizziness, fever, headache, chest pain or shortness of breath.    No past medical history on file.  There are no problems to display for this patient.   No past surgical history on file.     No family history on file.  Social History   Tobacco Use  . Smoking status: Current Every Day Smoker    Types: Cigarettes  . Smokeless tobacco: Never Used  Substance Use Topics  . Alcohol use: Yes  . Drug use: No    Types: Marijuana    Comment: denies    Home Medications Prior to Admission medications   Medication Sig Start Date End Date Taking? Authorizing Provider  ibuprofen (ADVIL,MOTRIN) 600 MG tablet Take 1 tablet (600 mg total) by mouth every 8 (eight) hours as needed. 03/22/17   Azalia Bilis, MD    Allergies    Patient has no known allergies.  Review of Systems   Review of Systems All other systems negative except as documented in the HPI. All pertinent positives and negatives as reviewed in the HPI. Physical Exam Updated Vital Signs BP 137/70 (BP Location: Right Arm)   Pulse 69   Temp 98.1 F (36.7 C) (Oral)   Resp 19   Ht 6' (1.829 m)   Wt 70.3 kg   SpO2 99%   BMI 21.02 kg/m   Physical  Exam Vitals and nursing note reviewed.  Constitutional:      General: He is not in acute distress.    Appearance: He is well-developed.  HENT:     Head: Normocephalic and atraumatic.     Mouth/Throat:   Eyes:     Pupils: Pupils are equal, round, and reactive to light.  Pulmonary:     Effort: Pulmonary effort is normal.  Skin:    General: Skin is warm and dry.  Neurological:     Mental Status: He is alert and oriented to person, place, and time.     ED Results / Procedures / Treatments   Labs (all labs ordered are listed, but only abnormal results are displayed) Labs Reviewed - No data to display  EKG None  Radiology No results found.  Procedures Procedures (including critical care time)  Medications Ordered in ED Medications - No data to display  ED Course  I have reviewed the triage vital signs and the nursing notes.  Pertinent labs & imaging results that were available during my care of the patient were reviewed by me and considered in my medical decision making (see chart for details).    MDM Rules/Calculators/A&P  Patient given follow-up with on-call dentistry.  Have advised patient to return here as needed.  Patient agrees the plan and all questions were answered.  Patient does not have any significant dental abscess at this time to be placed on antibiotics and told to rinse with warm water and peroxide 3 times a day. Final Clinical Impression(s) / ED Diagnoses Final diagnoses:  None    Rx / DC Orders ED Discharge Orders    None       [name], PA-C 08/03/19 1027    Sherwood Gambler, MD 08/04/19 [phone]

## 2019-08-03 NOTE — ED Triage Notes (Signed)
Pt arrives POV with c/o of right side dental pain. Tooth is cracked off and infected. Pt called demist and they said pt needed to clear up infection before tooth can be extracted.  Pt taking tylenol at home with no relief 8/10 pain

## 2019-08-03 NOTE — Discharge Instructions (Signed)
Follow-up with the dentist provided.  Return here as needed.  Rinse with warm water and peroxide 3 times a day.

## 2020-08-19 ENCOUNTER — Emergency Department (HOSPITAL_COMMUNITY): Payer: Medicaid Other

## 2020-08-19 ENCOUNTER — Inpatient Hospital Stay (HOSPITAL_COMMUNITY)
Admission: EM | Admit: 2020-08-19 | Discharge: 2020-08-24 | DRG: 493 | Disposition: A | Discharge status: Home / Self Care | Payer: Medicaid Other | Attending: General Surgery | Admitting: General Surgery

## 2020-08-19 DIAGNOSIS — S0101XA Laceration without foreign body of scalp, initial encounter: Secondary | ICD-10-CM | POA: Diagnosis present

## 2020-08-19 DIAGNOSIS — S27329A Contusion of lung, unspecified, initial encounter: Secondary | ICD-10-CM

## 2020-08-19 DIAGNOSIS — S61419A Laceration without foreign body of unspecified hand, initial encounter: Secondary | ICD-10-CM | POA: Diagnosis present

## 2020-08-19 DIAGNOSIS — Q619 Cystic kidney disease, unspecified: Secondary | ICD-10-CM

## 2020-08-19 DIAGNOSIS — S2242XA Multiple fractures of ribs, left side, initial encounter for closed fracture: Secondary | ICD-10-CM | POA: Diagnosis present

## 2020-08-19 DIAGNOSIS — S0181XA Laceration without foreign body of other part of head, initial encounter: Secondary | ICD-10-CM | POA: Diagnosis present

## 2020-08-19 DIAGNOSIS — S22019A Unspecified fracture of first thoracic vertebra, initial encounter for closed fracture: Secondary | ICD-10-CM | POA: Diagnosis present

## 2020-08-19 DIAGNOSIS — N281 Cyst of kidney, acquired: Secondary | ICD-10-CM | POA: Diagnosis present

## 2020-08-19 DIAGNOSIS — Y92410 Unspecified street and highway as the place of occurrence of the external cause: Secondary | ICD-10-CM

## 2020-08-19 DIAGNOSIS — S32029A Unspecified fracture of second lumbar vertebra, initial encounter for closed fracture: Secondary | ICD-10-CM | POA: Diagnosis present

## 2020-08-19 DIAGNOSIS — S01511A Laceration without foreign body of lip, initial encounter: Secondary | ICD-10-CM | POA: Diagnosis present

## 2020-08-19 DIAGNOSIS — S42102A Fracture of unspecified part of scapula, left shoulder, initial encounter for closed fracture: Secondary | ICD-10-CM

## 2020-08-19 DIAGNOSIS — S42112A Displaced fracture of body of scapula, left shoulder, initial encounter for closed fracture: Secondary | ICD-10-CM | POA: Diagnosis present

## 2020-08-19 DIAGNOSIS — T148XXA Other injury of unspecified body region, initial encounter: Secondary | ICD-10-CM

## 2020-08-19 DIAGNOSIS — T1490XA Injury, unspecified, initial encounter: Secondary | ICD-10-CM | POA: Diagnosis present

## 2020-08-19 DIAGNOSIS — J939 Pneumothorax, unspecified: Secondary | ICD-10-CM

## 2020-08-19 DIAGNOSIS — S42401A Unspecified fracture of lower end of right humerus, initial encounter for closed fracture: Secondary | ICD-10-CM

## 2020-08-19 DIAGNOSIS — S3991XA Unspecified injury of abdomen, initial encounter: Secondary | ICD-10-CM

## 2020-08-19 DIAGNOSIS — N179 Acute kidney failure, unspecified: Secondary | ICD-10-CM | POA: Diagnosis present

## 2020-08-19 DIAGNOSIS — S27322A Contusion of lung, bilateral, initial encounter: Secondary | ICD-10-CM | POA: Diagnosis present

## 2020-08-19 DIAGNOSIS — S42471A Displaced transcondylar fracture of right humerus, initial encounter for closed fracture: Principal | ICD-10-CM | POA: Diagnosis present

## 2020-08-19 DIAGNOSIS — S32019A Unspecified fracture of first lumbar vertebra, initial encounter for closed fracture: Secondary | ICD-10-CM | POA: Diagnosis present

## 2020-08-19 DIAGNOSIS — E559 Vitamin D deficiency, unspecified: Secondary | ICD-10-CM | POA: Diagnosis present

## 2020-08-19 DIAGNOSIS — Z79899 Other long term (current) drug therapy: Secondary | ICD-10-CM

## 2020-08-19 DIAGNOSIS — Z419 Encounter for procedure for purposes other than remedying health state, unspecified: Secondary | ICD-10-CM

## 2020-08-19 DIAGNOSIS — Z20822 Contact with and (suspected) exposure to covid-19: Secondary | ICD-10-CM | POA: Diagnosis present

## 2020-08-19 DIAGNOSIS — S2249XA Multiple fractures of ribs, unspecified side, initial encounter for closed fracture: Secondary | ICD-10-CM

## 2020-08-19 DIAGNOSIS — Z23 Encounter for immunization: Secondary | ICD-10-CM

## 2020-08-19 DIAGNOSIS — F17210 Nicotine dependence, cigarettes, uncomplicated: Secondary | ICD-10-CM | POA: Diagnosis present

## 2020-08-19 LAB — I-STAT CHEM 8, ED
BUN: 17 mg/dL (ref 6–20)
Calcium, Ion: 1.11 mmol/L — ABNORMAL LOW (ref 1.15–1.40)
Chloride: 103 mmol/L (ref 98–111)
Creatinine, Ser: 1.5 mg/dL — ABNORMAL HIGH (ref 0.61–1.24)
Glucose, Bld: 133 mg/dL — ABNORMAL HIGH (ref 70–99)
HCT: 44 % (ref 39.0–52.0)
Hemoglobin: 15 g/dL (ref 13.0–17.0)
Potassium: 3.2 mmol/L — ABNORMAL LOW (ref 3.5–5.1)
Sodium: 141 mmol/L (ref 135–145)
TCO2: 22 mmol/L (ref 22–32)

## 2020-08-19 LAB — SAMPLE TO BLOOD BANK

## 2020-08-19 LAB — PROTIME-INR
INR: 1 (ref 0.8–1.2)
Prothrombin Time: 13 seconds (ref 11.4–15.2)

## 2020-08-19 LAB — ETHANOL: Alcohol, Ethyl (B): 78 mg/dL — ABNORMAL HIGH (ref <10)

## 2020-08-19 LAB — COMPREHENSIVE METABOLIC PANEL
ALT: 71 U/L — ABNORMAL HIGH (ref 0–44)
AST: 152 U/L — ABNORMAL HIGH (ref 15–41)
Albumin: 4.2 g/dL (ref 3.5–5.0)
Alkaline Phosphatase: 36 U/L — ABNORMAL LOW (ref 38–126)
Anion gap: 13 (ref 5–15)
BUN: 14 mg/dL (ref 6–20)
CO2: 20 mmol/L — ABNORMAL LOW (ref 22–32)
Calcium: 8.9 mg/dL (ref 8.9–10.3)
Chloride: 105 mmol/L (ref 98–111)
Creatinine, Ser: 1.36 mg/dL — ABNORMAL HIGH (ref 0.61–1.24)
GFR, Estimated: >60 mL/min (ref >60)
Glucose, Bld: 134 mg/dL — ABNORMAL HIGH (ref 70–99)
Potassium: 3.3 mmol/L — ABNORMAL LOW (ref 3.5–5.1)
Sodium: 138 mmol/L (ref 135–145)
Total Bilirubin: 0.8 mg/dL (ref 0.3–1.2)
Total Protein: 6.4 g/dL — ABNORMAL LOW (ref 6.5–8.1)

## 2020-08-19 LAB — CBC
HCT: 44.7 % (ref 39.0–52.0)
Hemoglobin: 14.8 g/dL (ref 13.0–17.0)
MCH: 29.7 pg (ref 26.0–34.0)
MCHC: 33.1 g/dL (ref 30.0–36.0)
MCV: 89.8 fL (ref 80.0–100.0)
Platelets: 321 10*3/uL (ref 150–400)
RBC: 4.98 MIL/uL (ref 4.22–5.81)
RDW: 12.6 % (ref 11.5–15.5)
WBC: 15.3 10*3/uL — ABNORMAL HIGH (ref 4.0–10.5)
nRBC: 0 % (ref 0.0–0.2)

## 2020-08-19 LAB — LACTIC ACID, PLASMA: Lactic Acid, Venous: 3.6 mmol/L (ref 0.5–1.9)

## 2020-08-19 LAB — RESP PANEL BY RT-PCR (FLU A&B, COVID) ARPGX2
Influenza A by PCR: NEGATIVE
Influenza B by PCR: NEGATIVE
SARS Coronavirus 2 by RT PCR: NEGATIVE

## 2020-08-19 MED ORDER — SODIUM CHLORIDE 0.9 % IV BOLUS
1000.0000 mL | Freq: Once | INTRAVENOUS | Status: AC
  Administered 2020-08-19: 1000 mL via INTRAVENOUS

## 2020-08-19 MED ORDER — TETANUS-DIPHTH-ACELL PERTUSSIS 5-2.5-18.5 LF-MCG/0.5 IM SUSY
0.5000 mL | PREFILLED_SYRINGE | Freq: Once | INTRAMUSCULAR | Status: AC
  Administered 2020-08-19: 0.5 mL via INTRAMUSCULAR
  Filled 2020-08-19: qty 0.5

## 2020-08-19 MED ORDER — FENTANYL CITRATE (PF) 100 MCG/2ML IJ SOLN
100.0000 ug | Freq: Once | INTRAMUSCULAR | Status: DC
Start: 2020-08-20 — End: 2020-08-22

## 2020-08-19 MED ORDER — SODIUM CHLORIDE 0.9 % IV SOLN
INTRAVENOUS | Status: DC
  Administered 2020-08-21: 125 mL/h via INTRAVENOUS

## 2020-08-19 MED ORDER — IOHEXOL 300 MG/ML  SOLN
100.0000 mL | Freq: Once | INTRAMUSCULAR | Status: AC | PRN
  Administered 2020-08-19: 100 mL via INTRAVENOUS

## 2020-08-19 MED ORDER — CEFAZOLIN SODIUM-DEXTROSE 2-4 GM/100ML-% IV SOLN
2.0000 g | Freq: Once | INTRAVENOUS | Status: AC
  Administered 2020-08-19: 2 g via INTRAVENOUS
  Filled 2020-08-19: qty 100

## 2020-08-19 MED ORDER — MORPHINE SULFATE (PF) 4 MG/ML IV SOLN
INTRAVENOUS | Status: AC
  Filled 2020-08-19: qty 1

## 2020-08-19 MED ORDER — MORPHINE SULFATE (PF) 4 MG/ML IV SOLN
4.0000 mg | Freq: Once | INTRAVENOUS | Status: AC
  Administered 2020-08-19: 4 mg via INTRAVENOUS

## 2020-08-19 NOTE — ED Notes (Signed)
Patient transported to CT 
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
## 2020-08-19 NOTE — ED Notes (Addendum)
Lactic Acid 3.6 critical result notification by lab. MD Wickline made aware.
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
## 2020-08-20 ENCOUNTER — Inpatient Hospital Stay (HOSPITAL_COMMUNITY): Payer: Medicaid Other | Admitting: Certified Registered"

## 2020-08-20 ENCOUNTER — Encounter (HOSPITAL_COMMUNITY): Payer: Self-pay

## 2020-08-20 ENCOUNTER — Emergency Department (HOSPITAL_COMMUNITY): Payer: Medicaid Other

## 2020-08-20 ENCOUNTER — Inpatient Hospital Stay (HOSPITAL_COMMUNITY): Payer: Medicaid Other

## 2020-08-20 ENCOUNTER — Encounter (HOSPITAL_COMMUNITY): Admission: EM | Disposition: A | Discharge status: Home / Self Care | Payer: Self-pay | Source: Home / Self Care

## 2020-08-20 DIAGNOSIS — T1490XA Injury, unspecified, initial encounter: Secondary | ICD-10-CM | POA: Diagnosis present

## 2020-08-20 DIAGNOSIS — Z79899 Other long term (current) drug therapy: Secondary | ICD-10-CM | POA: Diagnosis not present

## 2020-08-20 DIAGNOSIS — S22019A Unspecified fracture of first thoracic vertebra, initial encounter for closed fracture: Secondary | ICD-10-CM | POA: Diagnosis not present

## 2020-08-20 DIAGNOSIS — F17210 Nicotine dependence, cigarettes, uncomplicated: Secondary | ICD-10-CM | POA: Diagnosis not present

## 2020-08-20 DIAGNOSIS — S27322A Contusion of lung, bilateral, initial encounter: Secondary | ICD-10-CM | POA: Diagnosis not present

## 2020-08-20 DIAGNOSIS — N179 Acute kidney failure, unspecified: Secondary | ICD-10-CM | POA: Diagnosis not present

## 2020-08-20 DIAGNOSIS — N281 Cyst of kidney, acquired: Secondary | ICD-10-CM | POA: Diagnosis not present

## 2020-08-20 DIAGNOSIS — S32019A Unspecified fracture of first lumbar vertebra, initial encounter for closed fracture: Secondary | ICD-10-CM | POA: Diagnosis not present

## 2020-08-20 DIAGNOSIS — Y92410 Unspecified street and highway as the place of occurrence of the external cause: Secondary | ICD-10-CM | POA: Diagnosis not present

## 2020-08-20 DIAGNOSIS — Z23 Encounter for immunization: Secondary | ICD-10-CM | POA: Diagnosis not present

## 2020-08-20 DIAGNOSIS — S0181XA Laceration without foreign body of other part of head, initial encounter: Secondary | ICD-10-CM | POA: Diagnosis not present

## 2020-08-20 DIAGNOSIS — S2242XA Multiple fractures of ribs, left side, initial encounter for closed fracture: Secondary | ICD-10-CM | POA: Diagnosis not present

## 2020-08-20 DIAGNOSIS — S01511A Laceration without foreign body of lip, initial encounter: Secondary | ICD-10-CM | POA: Diagnosis not present

## 2020-08-20 DIAGNOSIS — Z20822 Contact with and (suspected) exposure to covid-19: Secondary | ICD-10-CM | POA: Diagnosis not present

## 2020-08-20 DIAGNOSIS — S42471A Displaced transcondylar fracture of right humerus, initial encounter for closed fracture: Secondary | ICD-10-CM | POA: Diagnosis not present

## 2020-08-20 DIAGNOSIS — S2249XA Multiple fractures of ribs, unspecified side, initial encounter for closed fracture: Secondary | ICD-10-CM | POA: Diagnosis not present

## 2020-08-20 DIAGNOSIS — S32029A Unspecified fracture of second lumbar vertebra, initial encounter for closed fracture: Secondary | ICD-10-CM | POA: Diagnosis not present

## 2020-08-20 DIAGNOSIS — E559 Vitamin D deficiency, unspecified: Secondary | ICD-10-CM | POA: Diagnosis not present

## 2020-08-20 DIAGNOSIS — S0101XA Laceration without foreign body of scalp, initial encounter: Secondary | ICD-10-CM | POA: Diagnosis not present

## 2020-08-20 DIAGNOSIS — S42112A Displaced fracture of body of scapula, left shoulder, initial encounter for closed fracture: Secondary | ICD-10-CM | POA: Diagnosis not present

## 2020-08-20 DIAGNOSIS — S61419A Laceration without foreign body of unspecified hand, initial encounter: Secondary | ICD-10-CM | POA: Diagnosis not present

## 2020-08-20 HISTORY — PX: LACERATION REPAIR: SHX5284

## 2020-08-20 LAB — CBC
HCT: 38.9 % — ABNORMAL LOW (ref 39.0–52.0)
Hemoglobin: 12.8 g/dL — ABNORMAL LOW (ref 13.0–17.0)
MCH: 28.8 pg (ref 26.0–34.0)
MCHC: 32.9 g/dL (ref 30.0–36.0)
MCV: 87.6 fL (ref 80.0–100.0)
Platelets: 202 10*3/uL (ref 150–400)
RBC: 4.44 MIL/uL (ref 4.22–5.81)
RDW: 12.7 % (ref 11.5–15.5)
WBC: 12.1 10*3/uL — ABNORMAL HIGH (ref 4.0–10.5)
nRBC: 0 % (ref 0.0–0.2)

## 2020-08-20 LAB — BASIC METABOLIC PANEL
Anion gap: 12 (ref 5–15)
BUN: 12 mg/dL (ref 6–20)
CO2: 19 mmol/L — ABNORMAL LOW (ref 22–32)
Calcium: 8.5 mg/dL — ABNORMAL LOW (ref 8.9–10.3)
Chloride: 107 mmol/L (ref 98–111)
Creatinine, Ser: 1.28 mg/dL — ABNORMAL HIGH (ref 0.61–1.24)
GFR, Estimated: >60 mL/min (ref >60)
Glucose, Bld: 98 mg/dL (ref 70–99)
Potassium: 3.7 mmol/L (ref 3.5–5.1)
Sodium: 138 mmol/L (ref 135–145)

## 2020-08-20 LAB — URINALYSIS, MICROSCOPIC (REFLEX): RBC / HPF: >50 RBC/hpf (ref 0–5)

## 2020-08-20 LAB — URINALYSIS, ROUTINE W REFLEX MICROSCOPIC
Glucose, UA: NEGATIVE mg/dL
Ketones, ur: NEGATIVE mg/dL
Leukocytes,Ua: NEGATIVE
Nitrite: NEGATIVE
Protein, ur: 100 mg/dL — AB
Specific Gravity, Urine: 1.01 (ref 1.005–1.030)
pH: 6.5 (ref 5.0–8.0)

## 2020-08-20 LAB — HIV ANTIBODY (ROUTINE TESTING W REFLEX): HIV Screen 4th Generation wRfx: NONREACTIVE

## 2020-08-20 LAB — MRSA PCR SCREENING: MRSA by PCR: NEGATIVE

## 2020-08-20 SURGERY — REPAIR, LACERATION, 2 OR MORE
Anesthesia: General | Site: Face | Laterality: Right

## 2020-08-20 MED ORDER — PHENYLEPHRINE HCL (PRESSORS) 10 MG/ML IV SOLN
INTRAVENOUS | Status: DC | PRN
  Administered 2020-08-20 (×2): 80 ug via INTRAVENOUS
  Administered 2020-08-20: 40 ug via INTRAVENOUS
  Administered 2020-08-20: 80 ug via INTRAVENOUS
  Administered 2020-08-20: 40 ug via INTRAVENOUS
  Administered 2020-08-20: 80 ug via INTRAVENOUS

## 2020-08-20 MED ORDER — SODIUM CHLORIDE 0.9 % IV SOLN: INTRAVENOUS | Status: DC

## 2020-08-20 MED ORDER — LACTATED RINGERS IV SOLN: INTRAVENOUS | Status: DC

## 2020-08-20 MED ORDER — HYDROMORPHONE HCL 1 MG/ML IJ SOLN: 0.2500 mg | INTRAMUSCULAR | Status: DC | PRN

## 2020-08-20 MED ORDER — DEXAMETHASONE SODIUM PHOSPHATE 10 MG/ML IJ SOLN
INTRAMUSCULAR | Status: DC | PRN
  Administered 2020-08-20: 10 mg via INTRAVENOUS

## 2020-08-20 MED ORDER — CEFAZOLIN SODIUM-DEXTROSE 2-3 GM-%(50ML) IV SOLR
INTRAVENOUS | Status: DC | PRN
  Administered 2020-08-20: 2 g via INTRAVENOUS

## 2020-08-20 MED ORDER — LACTATED RINGERS IV BOLUS
1000.0000 mL | Freq: Once | INTRAVENOUS | Status: AC
  Administered 2020-08-20: 1000 mL via INTRAVENOUS

## 2020-08-20 MED ORDER — LIDOCAINE-EPINEPHRINE 1 %-1:100000 IJ SOLN
INTRAMUSCULAR | Status: AC
  Filled 2020-08-20: qty 1

## 2020-08-20 MED ORDER — SUGAMMADEX SODIUM 200 MG/2ML IV SOLN
INTRAVENOUS | Status: DC | PRN
  Administered 2020-08-20: 200 mg via INTRAVENOUS

## 2020-08-20 MED ORDER — FENTANYL CITRATE (PF) 250 MCG/5ML IJ SOLN
INTRAMUSCULAR | Status: AC
  Filled 2020-08-20: qty 5

## 2020-08-20 MED ORDER — MIDAZOLAM HCL 5 MG/5ML IJ SOLN
INTRAMUSCULAR | Status: DC | PRN
  Administered 2020-08-20: 2 mg via INTRAVENOUS

## 2020-08-20 MED ORDER — 0.9 % SODIUM CHLORIDE (POUR BTL) OPTIME
TOPICAL | Status: DC | PRN
  Administered 2020-08-20: 1000 mL

## 2020-08-20 MED ORDER — BSS IO SOLN
INTRAOCULAR | Status: AC
  Filled 2020-08-20: qty 15

## 2020-08-20 MED ORDER — CHLORHEXIDINE GLUCONATE 0.12 % MT SOLN: 15.0000 mL | Freq: Once | OROMUCOSAL | Status: DC

## 2020-08-20 MED ORDER — FENTANYL CITRATE (PF) 250 MCG/5ML IJ SOLN
INTRAMUSCULAR | Status: DC | PRN
  Administered 2020-08-20: 100 ug via INTRAVENOUS
  Administered 2020-08-20: 50 ug via INTRAVENOUS

## 2020-08-20 MED ORDER — HYDROMORPHONE HCL 1 MG/ML IJ SOLN
0.5000 mg | INTRAMUSCULAR | Status: DC | PRN
Start: 2020-08-20 — End: 2020-08-21
  Administered 2020-08-20: 0.5 mg via INTRAVENOUS
  Filled 2020-08-20: qty 1

## 2020-08-20 MED ORDER — BSS IO SOLN
INTRAOCULAR | Status: DC | PRN
  Administered 2020-08-20: 15 mL via INTRAOCULAR

## 2020-08-20 MED ORDER — OXYCODONE HCL 5 MG PO TABS
5.0000 mg | ORAL_TABLET | ORAL | Status: DC | PRN
Start: 2020-08-20 — End: 2020-08-22
  Administered 2020-08-20: 5 mg via ORAL
  Filled 2020-08-20 (×5): qty 1

## 2020-08-20 MED ORDER — HYDROMORPHONE HCL 1 MG/ML IJ SOLN: 0.5000 mg | INTRAMUSCULAR | Status: DC | PRN

## 2020-08-20 MED ORDER — METHOCARBAMOL 500 MG PO TABS: 500.0000 mg | ORAL_TABLET | Freq: Four times a day (QID) | ORAL | Status: DC | PRN

## 2020-08-20 MED ORDER — ONDANSETRON 4 MG PO TBDP: 4.0000 mg | ORAL_TABLET | Freq: Four times a day (QID) | ORAL | Status: DC | PRN

## 2020-08-20 MED ORDER — OXYCODONE HCL 5 MG PO TABS
5.0000 mg | ORAL_TABLET | ORAL | Status: DC | PRN
  Administered 2020-08-20 – 2020-08-21 (×4): 5 mg via ORAL

## 2020-08-20 MED ORDER — MIDAZOLAM HCL 2 MG/2ML IJ SOLN
INTRAMUSCULAR | Status: AC
  Filled 2020-08-20: qty 2

## 2020-08-20 MED ORDER — LIDOCAINE-EPINEPHRINE 1 %-1:100000 IJ SOLN
INTRAMUSCULAR | Status: DC | PRN
  Administered 2020-08-20: 15 mL

## 2020-08-20 MED ORDER — ACETAMINOPHEN 160 MG/5ML PO SOLN: 325.0000 mg | Freq: Once | ORAL | Status: DC | PRN

## 2020-08-20 MED ORDER — ROCURONIUM BROMIDE 10 MG/ML (PF) SYRINGE
PREFILLED_SYRINGE | INTRAVENOUS | Status: DC | PRN
  Administered 2020-08-20: 40 mg via INTRAVENOUS

## 2020-08-20 MED ORDER — GABAPENTIN 300 MG PO CAPS
300.0000 mg | ORAL_CAPSULE | Freq: Three times a day (TID) | ORAL | Status: DC
  Administered 2020-08-20 – 2020-08-24 (×10): 300 mg via ORAL
  Filled 2020-08-20 (×10): qty 1

## 2020-08-20 MED ORDER — OXYCODONE HCL 5 MG PO TABS: 10.0000 mg | ORAL_TABLET | ORAL | Status: DC | PRN

## 2020-08-20 MED ORDER — ONDANSETRON HCL 4 MG/2ML IJ SOLN
4.0000 mg | Freq: Four times a day (QID) | INTRAMUSCULAR | Status: DC | PRN
  Administered 2020-08-20: 4 mg via INTRAVENOUS

## 2020-08-20 MED ORDER — ACETAMINOPHEN 325 MG PO TABS: 325.0000 mg | ORAL_TABLET | Freq: Once | ORAL | Status: DC | PRN

## 2020-08-20 MED ORDER — ACETAMINOPHEN 325 MG PO TABS: 650.0000 mg | ORAL_TABLET | ORAL | Status: DC | PRN

## 2020-08-20 MED ORDER — CHLORHEXIDINE GLUCONATE 0.12 % MT SOLN
OROMUCOSAL | Status: AC
  Filled 2020-08-20: qty 15

## 2020-08-20 MED ORDER — SUCCINYLCHOLINE CHLORIDE 20 MG/ML IJ SOLN
INTRAMUSCULAR | Status: DC | PRN
  Administered 2020-08-20: 100 mg via INTRAVENOUS

## 2020-08-20 MED ORDER — PROPOFOL 10 MG/ML IV BOLUS
INTRAVENOUS | Status: DC | PRN
  Administered 2020-08-20: 150 mg via INTRAVENOUS

## 2020-08-20 MED ORDER — MEPERIDINE HCL 25 MG/ML IJ SOLN: 6.2500 mg | INTRAMUSCULAR | Status: DC | PRN

## 2020-08-20 MED ORDER — CHLORHEXIDINE GLUCONATE CLOTH 2 % EX PADS
6.0000 | MEDICATED_PAD | Freq: Every day | CUTANEOUS | Status: DC
  Administered 2020-08-21 – 2020-08-22 (×2): 6 via TOPICAL

## 2020-08-20 MED ORDER — ACETAMINOPHEN 10 MG/ML IV SOLN: 1000.0000 mg | Freq: Once | INTRAVENOUS | Status: DC | PRN

## 2020-08-20 MED ORDER — LIDOCAINE 2% (20 MG/ML) 5 ML SYRINGE
INTRAMUSCULAR | Status: DC | PRN
  Administered 2020-08-20: 40 mg via INTRAVENOUS

## 2020-08-20 MED ORDER — ACETAMINOPHEN 325 MG PO TABS: 650.0000 mg | ORAL_TABLET | Freq: Four times a day (QID) | ORAL | Status: DC

## 2020-08-20 MED ORDER — PROPOFOL 10 MG/ML IV BOLUS
INTRAVENOUS | Status: AC
  Filled 2020-08-20: qty 20

## 2020-08-20 MED ORDER — BACITRACIN ZINC 500 UNIT/GM EX OINT
TOPICAL_OINTMENT | CUTANEOUS | Status: AC
  Filled 2020-08-20: qty 28.35

## 2020-08-20 MED ORDER — AMISULPRIDE (ANTIEMETIC) 5 MG/2ML IV SOLN: 10.0000 mg | Freq: Once | INTRAVENOUS | Status: DC | PRN

## 2020-08-20 MED ORDER — ORAL CARE MOUTH RINSE: 15.0000 mL | Freq: Once | OROMUCOSAL | Status: DC

## 2020-08-20 MED ORDER — BACITRACIN ZINC 500 UNIT/GM EX OINT
TOPICAL_OINTMENT | CUTANEOUS | Status: DC | PRN
  Administered 2020-08-20: 1 via TOPICAL

## 2020-08-20 MED ORDER — ACETAMINOPHEN 325 MG PO TABS
650.0000 mg | ORAL_TABLET | Freq: Four times a day (QID) | ORAL | Status: DC
  Administered 2020-08-20 – 2020-08-21 (×7): 650 mg via ORAL
  Filled 2020-08-20 (×7): qty 2

## 2020-08-20 SURGICAL SUPPLY — 28 items
BNDG COHESIVE 4X5 TAN STRL (GAUZE/BANDAGES/DRESSINGS) ×2 IMPLANT
BNDG GAUZE ELAST 4 BULKY (GAUZE/BANDAGES/DRESSINGS) ×2 IMPLANT
COVER SURGICAL LIGHT HANDLE (MISCELLANEOUS) ×2 IMPLANT
ELECT REM PT RETURN 9FT ADLT (ELECTROSURGICAL) ×2
ELECTRODE REM PT RTRN 9FT ADLT (ELECTROSURGICAL) ×1 IMPLANT
GAUZE 4X4 16PLY RFD (DISPOSABLE) IMPLANT
GAUZE SPONGE 4X4 12PLY STRL (GAUZE/BANDAGES/DRESSINGS) ×2 IMPLANT
GAUZE XEROFORM 1X8 LF (GAUZE/BANDAGES/DRESSINGS) ×4 IMPLANT
GLOVE BIO SURGEON STRL SZ7.5 (GLOVE) ×4 IMPLANT
GOWN STRL REUS W/ TWL LRG LVL3 (GOWN DISPOSABLE) ×1 IMPLANT
GOWN STRL REUS W/ TWL XL LVL3 (GOWN DISPOSABLE) ×1 IMPLANT
GOWN STRL REUS W/TWL LRG LVL3 (GOWN DISPOSABLE) ×2
GOWN STRL REUS W/TWL XL LVL3 (GOWN DISPOSABLE) ×2
KIT BASIN OR (CUSTOM PROCEDURE TRAY) ×2 IMPLANT
KIT TURNOVER KIT B (KITS) ×2 IMPLANT
NEEDLE HYPO 25GX1X1/2 BEV (NEEDLE) ×2 IMPLANT
NS IRRIG 1000ML POUR BTL (IV SOLUTION) ×2 IMPLANT
PAD ARMBOARD 7.5X6 YLW CONV (MISCELLANEOUS) ×4 IMPLANT
PENCIL SMOKE EVACUATOR (MISCELLANEOUS) ×2 IMPLANT
POSITIONER HEAD DONUT 9IN (MISCELLANEOUS) ×2 IMPLANT
SOL PREP POV-IOD 4OZ 10% (MISCELLANEOUS) ×2 IMPLANT
SOL PREP PROV IODINE SCRUB 4OZ (MISCELLANEOUS) ×2 IMPLANT
SUT PLAIN 5 0 P 3 18 (SUTURE) ×4 IMPLANT
SUT PROLENE 5 0 P 3 (SUTURE) ×2 IMPLANT
SYR CONTROL 10ML LL (SYRINGE) ×2 IMPLANT
TOWEL GREEN STERILE FF (TOWEL DISPOSABLE) ×2 IMPLANT
TRAY ENT MC OR (CUSTOM PROCEDURE TRAY) ×2 IMPLANT
WATER STERILE IRR 1000ML POUR (IV SOLUTION) ×2 IMPLANT

## 2020-08-20 NOTE — ED Notes (Addendum)
Pt requesting staff to call mother Ditina [phone], no answer, left message
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
## 2020-08-20 NOTE — Plan of Care (Signed)

## 2020-08-20 NOTE — ED Provider Notes (Signed)
Discussed x-ray findings with Dr. Aundria Rud orthopedics.  Patient has right distal humerus fracture. Plan to place in long posterior splint.  Patient has distal pulses.  He is able to move his fingers.  This is a closed injury  CRITICAL CARE Performed by: Joya Gaskins Total critical care time: 30 minutes Critical care time was exclusive of separately billable procedures and treating other patients. Critical care was necessary to treat or prevent imminent or life-threatening deterioration. Critical care was time spent personally by me on the following activities: development of treatment plan with patient and/or surrogate as well as nursing, discussions with consultants, evaluation of patient's response to treatment, examination of patient, obtaining history from patient or surrogate, ordering and performing treatments and interventions, ordering and review of laboratory studies, ordering and review of radiographic studies, pulse oximetry and re-evaluation of patient's condition.    Zadie Rhine, MD 08/20/20 [phone]

## 2020-08-20 NOTE — Anesthesia Postprocedure Evaluation (Signed)
Anesthesia Post Note  Patient: Randy Wagner  Procedure(s) Performed: REPAIR  LACERATIONS, FOREHEAD (Right Face)     Patient location during evaluation: PACU Anesthesia Type: General Level of consciousness: awake and alert Pain management: pain level controlled Vital Signs Assessment: post-procedure vital signs reviewed and stable Respiratory status: spontaneous breathing, nonlabored ventilation, respiratory function stable and patient connected to nasal cannula oxygen Cardiovascular status: blood pressure returned to baseline and stable Postop Assessment: no apparent nausea or vomiting Anesthetic complications: no   No complications documented.  Last Vitals:  Vitals:   08/20/20 1145 08/20/20 1200  BP: 114/65 115/71  Pulse: 95 94  Resp: 18 (!) 21  Temp:  (!) 36.3 C  SpO2: 98% 96%    Last Pain:  Vitals:   08/20/20 1200  TempSrc:   PainSc: Asleep                 Shelton Silvas

## 2020-08-20 NOTE — ED Provider Notes (Signed)
I assumed care in signout to follow-up on imaging and to discuss with trauma.  I discussed the case with Dr. Fredricka Bonine with trauma surgery.  I discussed the case with Dr. Julien Girt with facial trauma.  He was informed of the deep wounds to the head and face that may require operative management.  He request to clean the wounds, packed the wounds, he will see in the morning and potentially take to the operating room We are awaiting extremity x-rays.   Zadie Rhine, MD 08/20/20 [phone]

## 2020-08-20 NOTE — ED Notes (Signed)
Pt has security envelope with $4,380.15 in money, and 1 net spend card. In lock box #11. Security envelope [id number]. Paperwork # is [id number].

## 2020-08-20 NOTE — Progress Notes (Signed)
OR surgeon called may give tylenol by mouth.
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
## 2020-08-20 NOTE — Progress Notes (Signed)
Calling Pharmacy to change patient's tylenol oral to rectal.  Patient refused rectal tylenol.
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
## 2020-08-20 NOTE — ED Provider Notes (Signed)
      Zadie Rhine, MD 08/20/20 [facility]

## 2020-08-20 NOTE — Anesthesia Procedure Notes (Signed)
Procedure Name: Intubation Date/Time: 08/20/2020 9:50 AM Performed by: Lavell Luster, CRNA Pre-anesthesia Checklist: Patient identified, Emergency Drugs available, Suction available, Patient being monitored and Timeout performed Patient Re-evaluated:Patient Re-evaluated prior to induction Oxygen Delivery Method: Circle system utilized Preoxygenation: Pre-oxygenation with 100% oxygen Induction Type: IV induction and Rapid sequence Laryngoscope Size: Mac and 4 Grade View: Grade I Tube type: Oral Tube size: 7.5 mm Number of attempts: 1 Airway Equipment and Method: Video-laryngoscopy and Stylet Placement Confirmation: breath sounds checked- equal and bilateral,  positive ETCO2 and ETT inserted through vocal cords under direct vision Secured at: 22 cm Dental Injury: Teeth and Oropharynx as per pre-operative assessment

## 2020-08-20 NOTE — Progress Notes (Signed)
Called patient's mother.  No answer left voice mail to call.

## 2020-08-20 NOTE — Progress Notes (Signed)
Orthopedic Tech Progress Note Patient Details:  Randy Wagner [birth year] [id number]  Ortho Devices Type of Ortho Device: Post (long arm) splint Ortho Device/Splint Location: rue. I applied a posterior long arm splint in position of comfort at drs request. Ortho Device/Splint Interventions: Ordered,Application,Adjustment   Post Interventions Patient Tolerated: Well Instructions Provided: Care of device,Adjustment of device   Trinna Post 08/20/2020, 2:13 AM

## 2020-08-20 NOTE — Op Note (Signed)
OPERATIVE REPORT:  Surgeon: Genia Del McDanielMD, DDS  Preoperative Diagnosis: - Laceration of the right brow, upper right eyelid and nose - Multiple foreign bodies identified on CT scan,     Postoperative Diagnosis: - Multiple lacerations of the right upper lip 4cm total - Disinsertion of the right lateral canthus - Stellate complex laceration of the right brow and upper eyelid- 10 cm total - 5 foreign bodies found  Procedure Performed: Primary repair of above facial lacerations above Reconstruction of right lateral canthus  Anesthesia:  General via oroendotracheal intubation.  Specimens: None  Drains: None  Cultures: None  Estimated Blood Loss: <5 mL  Dressings: None  Complications: None  Operative Findings: Lacerations were closed primarily   Procedure:   The patient was identified in the precare holding area and medical history and informed consent were reviewed with the patient and family. All questions were answered. The patient was taken via stretcher to the operating room and placed in the supine position on the operating table. Standard lines and monitors were applied. The patient was preoxygenated and general anesthesia was induced via IV. The patient was intubated without complications. The tube was secured. Bilateral breath sounds and end tidal CO2 were noted.The bed was turned 90 degrees.   The patient was then cleaned, prepped and draped using sterile technique. 15 cc of 2% lidocaine with epinephrine was injected into the lacerations.   All lacerations were thoroughly irrigated with copious normal saline. 5 separate small pieces of glass was removed from the right brow lacerations and right lip and right lip laceration. The right lateral canthus was reconstructed using 5-0 prolene sutures. Next the tissue was closed deep in brow/eyelid region with 3-0 monocryl sutures. All skin was then re-approximated with 5-0 fast gut where  able, areas of abrasion were left to granulate in. All areas were coated with bacitracin. The eye was irrigated with copious balanced salt solution. The incisions were then covered with xeroform gauze, kerlix and coband.   An orogastric tube was placed and gastric content was witnessed and removed. Next, prep and drapes were removed. The patient was cleansed and dried and turned back to the Anesthesia Care team where he was awakened without event and transferred to the PACU by the Anesthesia Care team for postoperative recovery.
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
## 2020-08-20 NOTE — Anesthesia Preprocedure Evaluation (Addendum)
Anesthesia Evaluation  Patient identified by MRN, date of birth, ID band Patient awake    Reviewed: Allergy & Precautions, NPO status , Patient's Chart, lab work & pertinent test results  Airway Mallampati: III  TM Distance: >3 FB   Mouth opening: Limited Mouth Opening  Dental  (+) Teeth Intact, Dental Advisory Given   Pulmonary Current Smoker and Patient abstained from smoking.,    Pulmonary exam normal        Cardiovascular negative cardio ROS   Rhythm:Regular Rate:Normal     Neuro/Psych negative psych ROS   GI/Hepatic negative GI ROS, Neg liver ROS,   Endo/Other  negative endocrine ROS  Renal/GU negative Renal ROS     Musculoskeletal negative musculoskeletal ROS (+)   Abdominal Normal abdominal exam  (+)   Peds  Hematology negative hematology ROS (+)   Anesthesia Other Findings   Reproductive/Obstetrics                            Anesthesia Physical Anesthesia Plan  ASA: I  Anesthesia Plan: General   Post-op Pain Management:    Induction: Intravenous, Rapid sequence and Cricoid pressure planned  PONV Risk Score and Plan: 2 and Ondansetron, Dexamethasone and Midazolam  Airway Management Planned: Oral ETT and Video Laryngoscope Planned  Additional Equipment: None  Intra-op Plan:   Post-operative Plan: Extubation in OR  Informed Consent: I have reviewed the patients History and Physical, chart, labs and discussed the procedure including the risks, benefits and alternatives for the proposed anesthesia with the patient or authorized representative who has indicated his/her understanding and acceptance.     Dental advisory given  Plan Discussed with: CRNA  Anesthesia Plan Comments:         Anesthesia Quick Evaluation

## 2020-08-20 NOTE — ED Notes (Signed)
Report Called to 4N

## 2020-08-20 NOTE — Transfer of Care (Signed)
Immediate Anesthesia Transfer of Care Note  Patient: Randy Wagner  Procedure(s) Performed: REPAIR  LACERATIONS, FOREHEAD (Right Face)  Patient Location: PACU  Anesthesia Type:General  Level of Consciousness: awake, alert  and sedated  Airway & Oxygen Therapy: Patient connected to face mask oxygen  Post-op Assessment: Post -op Vital signs reviewed and stable  Post vital signs: stable  Last Vitals:  Vitals Value Taken Time  BP 113/61 08/20/20 1130  Temp    Pulse 100 08/20/20 1131  Resp 22 08/20/20 1131  SpO2 92 % 08/20/20 1131  Vitals shown include unvalidated device data.  Last Pain:  Vitals:   08/20/20 0931  TempSrc:   PainSc: 4       Patients Stated Pain Goal: 3 (08/20/20 0931)  Complications: No complications documented.

## 2020-08-20 NOTE — ED Notes (Signed)
Ortho at bedside for splint placement
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
## 2020-08-20 NOTE — Progress Notes (Signed)
Orthopedic Tech Progress Note Patient Details:  Randy Wagner [birth year] [id number]  Ortho Devices Type of Ortho Device: Sling immobilizer Ortho Device/Splint Location: Right Upper Extremity Ortho Device/Splint Interventions: Ordered, Delivered to pt bedside, informed RN that pt did not want to put on the sling at the time of my intervention, sling will be applied by nurse staff   Post Interventions Patient Tolerated: Refused intervention Instructions Provided: Adjustment of device,Care of device,Poper ambulation with device   Gerald Stabs 08/20/2020, 5:12 PM

## 2020-08-20 NOTE — Plan of Care (Signed)
  Problem: Education: Goal: Knowledge of General Education information will improve Description: Including pain rating scale, medication(s)/side effects and non-pharmacologic comfort measures Outcome: Progressing   Problem: Health Behavior/Discharge Planning: Goal: Ability to manage health-related needs will improve Outcome: Progressing   Problem: Clinical Measurements: Goal: Ability to maintain clinical measurements within normal limits will improve Outcome: Progressing Goal: Will remain free from infection Outcome: Progressing Goal: Diagnostic test results will improve Outcome: Progressing Goal: Respiratory complications will improve Outcome: Progressing Goal: Cardiovascular complication will be avoided Outcome: Progressing   Problem: Education: Goal: Knowledge of General Education information will improve Description: Including pain rating scale, medication(s)/side effects and non-pharmacologic comfort measures 08/20/2020 0517 by Stacie Acres, RN Outcome: Progressing 08/20/2020 0516 by Stacie Acres, RN Outcome: Progressing

## 2020-08-20 NOTE — ED Provider Notes (Signed)
SPLINT APPLICATION Date/Time: 11am Authorized by: Joya Gaskins Consent: Verbal consent obtained. Risks and benefits: risks, benefits and alternatives were discussed Consent given by: patient Splint applied by: orthopedic technician Location details: right arm Splint type: posterior long Supplies used: ortho glass Post-procedure: The splinted body part was neurovascularly unchanged following the procedure. Patient tolerance: Patient tolerated the procedure well with no immediate complications.      Zadie Rhine, MD 08/20/20 [phone]

## 2020-08-21 ENCOUNTER — Inpatient Hospital Stay (HOSPITAL_COMMUNITY): Payer: Medicaid Other

## 2020-08-21 ENCOUNTER — Encounter (HOSPITAL_COMMUNITY): Payer: Self-pay | Admitting: Student

## 2020-08-21 LAB — CBC
HCT: 31.6 % — ABNORMAL LOW (ref 39.0–52.0)
Hemoglobin: 10.8 g/dL — ABNORMAL LOW (ref 13.0–17.0)
MCH: 30.2 pg (ref 26.0–34.0)
MCHC: 34.2 g/dL (ref 30.0–36.0)
MCV: 88.3 fL (ref 80.0–100.0)
Platelets: 176 10*3/uL (ref 150–400)
RBC: 3.58 MIL/uL — ABNORMAL LOW (ref 4.22–5.81)
RDW: 13.1 % (ref 11.5–15.5)
WBC: 9.5 10*3/uL (ref 4.0–10.5)
nRBC: 0 % (ref 0.0–0.2)

## 2020-08-21 LAB — BASIC METABOLIC PANEL
Anion gap: 8 (ref 5–15)
BUN: 13 mg/dL (ref 6–20)
CO2: 24 mmol/L (ref 22–32)
Calcium: 8.1 mg/dL — ABNORMAL LOW (ref 8.9–10.3)
Chloride: 107 mmol/L (ref 98–111)
Creatinine, Ser: 1.27 mg/dL — ABNORMAL HIGH (ref 0.61–1.24)
GFR, Estimated: >60 mL/min (ref >60)
Glucose, Bld: 89 mg/dL (ref 70–99)
Potassium: 3.7 mmol/L (ref 3.5–5.1)
Sodium: 139 mmol/L (ref 135–145)

## 2020-08-21 MED ORDER — HYDROMORPHONE HCL 1 MG/ML IJ SOLN
0.5000 mg | INTRAMUSCULAR | Status: DC | PRN
Start: 2020-08-21 — End: 2020-08-24
  Administered 2020-08-22 – 2020-08-23 (×3): 0.5 mg via INTRAVENOUS
  Filled 2020-08-21 (×3): qty 1

## 2020-08-21 MED ORDER — DIPHENHYDRAMINE HCL 50 MG/ML IJ SOLN
25.0000 mg | Freq: Four times a day (QID) | INTRAMUSCULAR | Status: DC | PRN
  Administered 2020-08-21 – 2020-08-23 (×2): 25 mg via INTRAVENOUS
  Filled 2020-08-21 (×2): qty 1
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
## 2020-08-21 NOTE — Progress Notes (Signed)
Shift Summary: ORIF rescheduled for tomorrow, pt able to eat and drink until 0000. No events.   N: AAOx4, MAE, limited movement RUE r/t fracture. R eye laceration dressed. PERRLA, afebrile. Managing pain with scheduled acetaminophen, gabapentin, and prn 5mg  oxycodone.   R: RA, clear lungs  CV: NSR 70-80, no ectopy. BP WDL.   GI: NPO for OR this AM, surgery postponed, regular diet with good appetite. Active tones, no BM.   GU: foley in place, adequate UOP pink/red with sediment and clots.   No new skin issues, dressing CDI and intact. NS running per order.   Mother and significant other at bedside throughout shift.   Buddie Marston RN
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
## 2020-08-22 ENCOUNTER — Encounter (HOSPITAL_COMMUNITY): Payer: Self-pay

## 2020-08-22 ENCOUNTER — Inpatient Hospital Stay (HOSPITAL_COMMUNITY): Payer: Medicaid Other

## 2020-08-22 ENCOUNTER — Inpatient Hospital Stay (HOSPITAL_COMMUNITY): Payer: Medicaid Other | Admitting: Anesthesiology

## 2020-08-22 ENCOUNTER — Encounter (HOSPITAL_COMMUNITY): Admission: EM | Disposition: A | Discharge status: Home / Self Care | Payer: Self-pay | Source: Home / Self Care

## 2020-08-22 DIAGNOSIS — Z20822 Contact with and (suspected) exposure to covid-19: Secondary | ICD-10-CM | POA: Diagnosis not present

## 2020-08-22 DIAGNOSIS — S42471A Displaced transcondylar fracture of right humerus, initial encounter for closed fracture: Secondary | ICD-10-CM | POA: Diagnosis not present

## 2020-08-22 DIAGNOSIS — Z23 Encounter for immunization: Secondary | ICD-10-CM | POA: Diagnosis not present

## 2020-08-22 DIAGNOSIS — S32029A Unspecified fracture of second lumbar vertebra, initial encounter for closed fracture: Secondary | ICD-10-CM | POA: Diagnosis not present

## 2020-08-22 HISTORY — PX: ORIF HUMERUS FRACTURE: SHX2126

## 2020-08-22 LAB — CBC
HCT: 32.8 % — ABNORMAL LOW (ref 39.0–52.0)
Hemoglobin: 10.4 g/dL — ABNORMAL LOW (ref 13.0–17.0)
MCH: 28.7 pg (ref 26.0–34.0)
MCHC: 31.7 g/dL (ref 30.0–36.0)
MCV: 90.4 fL (ref 80.0–100.0)
Platelets: 159 10*3/uL (ref 150–400)
RBC: 3.63 MIL/uL — ABNORMAL LOW (ref 4.22–5.81)
RDW: 13.2 % (ref 11.5–15.5)
WBC: 8.8 10*3/uL (ref 4.0–10.5)
nRBC: 0 % (ref 0.0–0.2)

## 2020-08-22 LAB — BASIC METABOLIC PANEL
Anion gap: 6 (ref 5–15)
BUN: 14 mg/dL (ref 6–20)
CO2: 24 mmol/L (ref 22–32)
Calcium: 7.8 mg/dL — ABNORMAL LOW (ref 8.9–10.3)
Chloride: 109 mmol/L (ref 98–111)
Creatinine, Ser: 1.22 mg/dL (ref 0.61–1.24)
GFR, Estimated: >60 mL/min (ref >60)
Glucose, Bld: 90 mg/dL (ref 70–99)
Potassium: 3.7 mmol/L (ref 3.5–5.1)
Sodium: 139 mmol/L (ref 135–145)

## 2020-08-22 LAB — SURGICAL PCR SCREEN
MRSA, PCR: NEGATIVE
Staphylococcus aureus: NEGATIVE

## 2020-08-22 SURGERY — OPEN REDUCTION INTERNAL FIXATION (ORIF) DISTAL HUMERUS FRACTURE
Anesthesia: General | Site: Elbow | Laterality: Right

## 2020-08-22 MED ORDER — HYDROMORPHONE HCL 1 MG/ML IJ SOLN: 0.5000 mg | Freq: Once | INTRAMUSCULAR | Status: DC

## 2020-08-22 MED ORDER — ORAL CARE MOUTH RINSE: 15.0000 mL | Freq: Once | OROMUCOSAL | Status: AC

## 2020-08-22 MED ORDER — CEFAZOLIN SODIUM-DEXTROSE 2-4 GM/100ML-% IV SOLN
2.0000 g | Freq: Three times a day (TID) | INTRAVENOUS | Status: AC
Start: 2020-08-22 — End: 2020-08-23
  Administered 2020-08-22 – 2020-08-23 (×3): 2 g via INTRAVENOUS
  Filled 2020-08-22 (×3): qty 100

## 2020-08-22 MED ORDER — METHOCARBAMOL 500 MG PO TABS
1000.0000 mg | ORAL_TABLET | Freq: Three times a day (TID) | ORAL | Status: DC
  Administered 2020-08-22 – 2020-08-24 (×5): 1000 mg via ORAL
  Filled 2020-08-22 (×5): qty 2

## 2020-08-22 MED ORDER — ROCURONIUM BROMIDE 10 MG/ML (PF) SYRINGE
PREFILLED_SYRINGE | INTRAVENOUS | Status: DC | PRN
  Administered 2020-08-22: 60 mg via INTRAVENOUS

## 2020-08-22 MED ORDER — PROPOFOL 10 MG/ML IV BOLUS
INTRAVENOUS | Status: DC | PRN
  Administered 2020-08-22: 200 mg via INTRAVENOUS

## 2020-08-22 MED ORDER — MIDAZOLAM HCL 2 MG/2ML IJ SOLN
INTRAMUSCULAR | Status: AC
  Filled 2020-08-22: qty 2

## 2020-08-22 MED ORDER — FENTANYL CITRATE (PF) 250 MCG/5ML IJ SOLN
INTRAMUSCULAR | Status: DC | PRN
  Administered 2020-08-22: 100 ug via INTRAVENOUS

## 2020-08-22 MED ORDER — ACETAMINOPHEN 10 MG/ML IV SOLN
INTRAVENOUS | Status: DC | PRN
  Administered 2020-08-22: 1000 mg via INTRAVENOUS

## 2020-08-22 MED ORDER — ONDANSETRON HCL 4 MG/2ML IJ SOLN
INTRAMUSCULAR | Status: AC
  Filled 2020-08-22: qty 2

## 2020-08-22 MED ORDER — SUGAMMADEX SODIUM 200 MG/2ML IV SOLN
INTRAVENOUS | Status: DC | PRN
  Administered 2020-08-22: 200 mg via INTRAVENOUS

## 2020-08-22 MED ORDER — ENOXAPARIN SODIUM 30 MG/0.3ML ~~LOC~~ SOLN
30.0000 mg | Freq: Two times a day (BID) | SUBCUTANEOUS | Status: DC
  Administered 2020-08-22 – 2020-08-24 (×4): 30 mg via SUBCUTANEOUS
  Filled 2020-08-22 (×4): qty 0.3

## 2020-08-22 MED ORDER — ONDANSETRON HCL 4 MG/2ML IJ SOLN
INTRAMUSCULAR | Status: DC | PRN
  Administered 2020-08-22: 4 mg via INTRAVENOUS

## 2020-08-22 MED ORDER — ACETAMINOPHEN 500 MG PO TABS
1000.0000 mg | ORAL_TABLET | Freq: Four times a day (QID) | ORAL | Status: DC
  Administered 2020-08-23 – 2020-08-24 (×7): 1000 mg via ORAL
  Filled 2020-08-22 (×7): qty 2

## 2020-08-22 MED ORDER — LIDOCAINE 2% (20 MG/ML) 5 ML SYRINGE
INTRAMUSCULAR | Status: DC | PRN
  Administered 2020-08-22: 60 mg via INTRAVENOUS

## 2020-08-22 MED ORDER — CHLORHEXIDINE GLUCONATE 0.12 % MT SOLN: 15.0000 mL | Freq: Once | OROMUCOSAL | Status: AC

## 2020-08-22 MED ORDER — FENTANYL CITRATE (PF) 100 MCG/2ML IJ SOLN
INTRAMUSCULAR | Status: AC
  Administered 2020-08-22: 50 ug via INTRAVENOUS
  Filled 2020-08-22: qty 2

## 2020-08-22 MED ORDER — DEXAMETHASONE SODIUM PHOSPHATE 10 MG/ML IJ SOLN
INTRAMUSCULAR | Status: AC
  Filled 2020-08-22: qty 1

## 2020-08-22 MED ORDER — MIDAZOLAM HCL 2 MG/2ML IJ SOLN
INTRAMUSCULAR | Status: DC | PRN
  Administered 2020-08-22: 2 mg via INTRAVENOUS

## 2020-08-22 MED ORDER — MIDAZOLAM HCL 2 MG/2ML IJ SOLN
INTRAMUSCULAR | Status: AC
  Administered 2020-08-22: 1 mg via INTRAVENOUS
  Filled 2020-08-22: qty 2

## 2020-08-22 MED ORDER — DEXAMETHASONE SODIUM PHOSPHATE 10 MG/ML IJ SOLN
INTRAMUSCULAR | Status: DC | PRN
  Administered 2020-08-22: 8 mg via INTRAVENOUS

## 2020-08-22 MED ORDER — LACTATED RINGERS IV BOLUS
1000.0000 mL | Freq: Once | INTRAVENOUS | Status: AC
  Administered 2020-08-22: 1000 mL via INTRAVENOUS

## 2020-08-22 MED ORDER — PHENYLEPHRINE 40 MCG/ML (10ML) SYRINGE FOR IV PUSH (FOR BLOOD PRESSURE SUPPORT)
PREFILLED_SYRINGE | INTRAVENOUS | Status: AC
  Filled 2020-08-22: qty 10

## 2020-08-22 MED ORDER — BUPIVACAINE HCL (PF) 0.25 % IJ SOLN
INTRAMUSCULAR | Status: AC
  Filled 2020-08-22: qty 30

## 2020-08-22 MED ORDER — PROPOFOL 10 MG/ML IV BOLUS
INTRAVENOUS | Status: AC
  Filled 2020-08-22: qty 40

## 2020-08-22 MED ORDER — DEXMEDETOMIDINE (PRECEDEX) IN NS 20 MCG/5ML (4 MCG/ML) IV SYRINGE
PREFILLED_SYRINGE | INTRAVENOUS | Status: DC | PRN
  Administered 2020-08-22: 8 ug via INTRAVENOUS

## 2020-08-22 MED ORDER — LIDOCAINE 2% (20 MG/ML) 5 ML SYRINGE
INTRAMUSCULAR | Status: AC
  Filled 2020-08-22: qty 5

## 2020-08-22 MED ORDER — CHLORHEXIDINE GLUCONATE 0.12 % MT SOLN
OROMUCOSAL | Status: AC
  Administered 2020-08-22: 15 mL via OROMUCOSAL
  Filled 2020-08-22: qty 15

## 2020-08-22 MED ORDER — DEXMEDETOMIDINE (PRECEDEX) IN NS 20 MCG/5ML (4 MCG/ML) IV SYRINGE
PREFILLED_SYRINGE | INTRAVENOUS | Status: AC
  Filled 2020-08-22: qty 5

## 2020-08-22 MED ORDER — PHENYLEPHRINE 40 MCG/ML (10ML) SYRINGE FOR IV PUSH (FOR BLOOD PRESSURE SUPPORT)
PREFILLED_SYRINGE | INTRAVENOUS | Status: DC | PRN
  Administered 2020-08-22: 80 ug via INTRAVENOUS

## 2020-08-22 MED ORDER — FENTANYL CITRATE (PF) 100 MCG/2ML IJ SOLN: 50.0000 ug | Freq: Once | INTRAMUSCULAR | Status: AC

## 2020-08-22 MED ORDER — CEFAZOLIN SODIUM-DEXTROSE 2-4 GM/100ML-% IV SOLN
2.0000 g | INTRAVENOUS | Status: AC
  Administered 2020-08-22: 2 g via INTRAVENOUS
  Filled 2020-08-22 (×2): qty 100

## 2020-08-22 MED ORDER — ALBUMIN HUMAN 5 % IV SOLN: INTRAVENOUS | Status: DC | PRN

## 2020-08-22 MED ORDER — OXYCODONE HCL 5 MG/5ML PO SOLN
5.0000 mg | ORAL | Status: DC | PRN
  Administered 2020-08-23 (×2): 10 mg via ORAL
  Filled 2020-08-22 (×2): qty 10

## 2020-08-22 MED ORDER — ROPIVACAINE HCL 5 MG/ML IJ SOLN
INTRAMUSCULAR | Status: DC | PRN
  Administered 2020-08-22: 30 mL via PERINEURAL

## 2020-08-22 MED ORDER — HYDROMORPHONE HCL 1 MG/ML IJ SOLN
1.0000 mg | Freq: Once | INTRAMUSCULAR | Status: AC
  Administered 2020-08-22: 1 mg via INTRAVENOUS
  Filled 2020-08-22: qty 1

## 2020-08-22 MED ORDER — PHENYLEPHRINE HCL-NACL 10-0.9 MG/250ML-% IV SOLN
INTRAVENOUS | Status: DC | PRN
  Administered 2020-08-22: 25 ug/min via INTRAVENOUS

## 2020-08-22 MED ORDER — 0.9 % SODIUM CHLORIDE (POUR BTL) OPTIME
TOPICAL | Status: DC | PRN
  Administered 2020-08-22: 1000 mL

## 2020-08-22 MED ORDER — MIDAZOLAM HCL 2 MG/2ML IJ SOLN: 1.0000 mg | Freq: Once | INTRAMUSCULAR | Status: AC

## 2020-08-22 MED ORDER — FENTANYL CITRATE (PF) 250 MCG/5ML IJ SOLN
INTRAMUSCULAR | Status: AC
  Filled 2020-08-22: qty 5

## 2020-08-22 MED ORDER — ROCURONIUM BROMIDE 10 MG/ML (PF) SYRINGE
PREFILLED_SYRINGE | INTRAVENOUS | Status: AC
  Filled 2020-08-22: qty 10

## 2020-08-22 MED ORDER — ACETAMINOPHEN 10 MG/ML IV SOLN
INTRAVENOUS | Status: AC
  Filled 2020-08-22: qty 100

## 2020-08-22 MED ORDER — LACTATED RINGERS IV SOLN: INTRAVENOUS | Status: DC

## 2020-08-22 SURGICAL SUPPLY — 93 items
BIT DRILL CALIB QC 170X80 (BIT) ×2 IMPLANT
BIT DRILL L45 QC 2.7X125 (BIT) ×1 IMPLANT
BIT DRILL QC 2.7X125 (BIT) ×2
BIT DRILL QC 2X140 (BIT) ×2 IMPLANT
BIT DRILL QC SFS 2.5X170 (BIT) ×2 IMPLANT
BLADE AVERAGE 25X9 (BLADE) IMPLANT
BNDG COHESIVE 4X5 TAN STRL (GAUZE/BANDAGES/DRESSINGS) ×2 IMPLANT
BNDG ELASTIC 3X5.8 VLCR NS LF (GAUZE/BANDAGES/DRESSINGS) ×2 IMPLANT
BNDG ELASTIC 4X5.8 VLCR NS LF (GAUZE/BANDAGES/DRESSINGS) ×4 IMPLANT
BNDG ESMARK 4X9 LF (GAUZE/BANDAGES/DRESSINGS) ×2 IMPLANT
BNDG GAUZE ELAST 4 BULKY (GAUZE/BANDAGES/DRESSINGS) ×4 IMPLANT
BRUSH SCRUB EZ PLAIN DRY (MISCELLANEOUS) ×4 IMPLANT
CANISTER SUCT 3000ML PPV (MISCELLANEOUS) ×2 IMPLANT
CORD BIPOLAR FORCEPS 12FT (ELECTRODE) ×2 IMPLANT
COVER SURGICAL LIGHT HANDLE (MISCELLANEOUS) ×2 IMPLANT
COVER WAND RF STERILE (DRAPES) IMPLANT
DRAIN PENROSE 1/4X12 LTX STRL (WOUND CARE) ×2 IMPLANT
DRAPE C-ARM 42X72 X-RAY (DRAPES) ×2 IMPLANT
DRAPE C-ARMOR (DRAPES) ×2 IMPLANT
DRAPE HALF SHEET 40X57 (DRAPES) ×2 IMPLANT
DRAPE INCISE IOBAN 66X45 STRL (DRAPES) ×4 IMPLANT
DRAPE ORTHO SPLIT 77X108 STRL (DRAPES) ×2
DRAPE SURG ORHT 6 SPLT 77X108 (DRAPES) ×1 IMPLANT
DRAPE U-SHAPE 47X51 STRL (DRAPES) ×2 IMPLANT
DRSG ADAPTIC 3X8 NADH LF (GAUZE/BANDAGES/DRESSINGS) IMPLANT
DRSG MEPILEX BORDER 4X12 (GAUZE/BANDAGES/DRESSINGS) ×2 IMPLANT
DRSG MEPILEX BORDER 4X8 (GAUZE/BANDAGES/DRESSINGS) ×2 IMPLANT
DRSG MEPITEL 4X7.2 (GAUZE/BANDAGES/DRESSINGS) IMPLANT
DRSG PAD ABDOMINAL 8X10 ST (GAUZE/BANDAGES/DRESSINGS) ×2 IMPLANT
ELECT REM PT RETURN 9FT ADLT (ELECTROSURGICAL) ×2
ELECTRODE REM PT RTRN 9FT ADLT (ELECTROSURGICAL) ×1 IMPLANT
EVACUATOR 1/8 PVC DRAIN (DRAIN) IMPLANT
GAUZE SPONGE 4X4 12PLY STRL (GAUZE/BANDAGES/DRESSINGS) ×4 IMPLANT
GLOVE BIO SURGEON STRL SZ7.5 (GLOVE) ×2 IMPLANT
GLOVE BIOGEL PI IND STRL 7.5 (GLOVE) ×1 IMPLANT
GLOVE BIOGEL PI INDICATOR 7.5 (GLOVE) ×1
GLOVE SRG 8 PF TXTR STRL LF DI (GLOVE) ×1 IMPLANT
GLOVE SURG UNDER POLY LF SZ8 (GLOVE) ×2
GOWN STRL REUS W/ TWL LRG LVL3 (GOWN DISPOSABLE) ×2 IMPLANT
GOWN STRL REUS W/ TWL XL LVL3 (GOWN DISPOSABLE) ×2 IMPLANT
GOWN STRL REUS W/TWL LRG LVL3 (GOWN DISPOSABLE) ×4
GOWN STRL REUS W/TWL XL LVL3 (GOWN DISPOSABLE) ×4
KIT BASIN OR (CUSTOM PROCEDURE TRAY) ×2 IMPLANT
KIT TURNOVER KIT B (KITS) ×2 IMPLANT
MANIFOLD NEPTUNE II (INSTRUMENTS) IMPLANT
NEEDLE HYPO 25X1 1.5 SAFETY (NEEDLE) IMPLANT
NS IRRIG 1000ML POUR BTL (IV SOLUTION) ×2 IMPLANT
PACK ORTHO EXTREMITY (CUSTOM PROCEDURE TRAY) ×2 IMPLANT
PAD ARMBOARD 7.5X6 YLW CONV (MISCELLANEOUS) ×6 IMPLANT
PADDING CAST ABS 3INX4YD NS (CAST SUPPLIES) ×1
PADDING CAST ABS 4INX4YD NS (CAST SUPPLIES) ×1
PADDING CAST ABS COTTON 3X4 (CAST SUPPLIES) ×1 IMPLANT
PADDING CAST ABS COTTON 4X4 ST (CAST SUPPLIES) ×1 IMPLANT
PLATE DIST HUMERUS PL 2.7/3.7 (Plate) ×2 IMPLANT
PLATE DIST VA 2.7X134 6H RT (Plate) ×2 IMPLANT
SCREW CORTEX 2.7X22 ST (Screw) ×1 IMPLANT
SCREW CORTEX 2.7X26MM (Screw) ×4 IMPLANT
SCREW CORTEX 2.7X34MM SLF (Screw) ×2 IMPLANT
SCREW CORTEX 3.5 24MM (Screw) ×1 IMPLANT
SCREW CORTEX 3.5 26MM (Screw) ×5 IMPLANT
SCREW CORTICAL 2.7X24MM (Screw) ×2 IMPLANT
SCREW LOCK CORT ST 3.5X24 (Screw) ×1 IMPLANT
SCREW LOCK CORT ST 3.5X26 (Screw) ×5 IMPLANT
SCREW LOCK T8 24X2.7XSTVA (Screw) ×1 IMPLANT
SCREW LOCK VA ST 2.7X26 (Screw) ×4 IMPLANT
SCREW LOCKING 2.7X24MM (Screw) ×2 IMPLANT
SCREW LOCKING 2.7X60MM (Screw) ×2 IMPLANT
SCREW LOCKING VA 2.7X40MM (Screw) ×4 IMPLANT
SCREW METAPHYSEAL 2.7X24MM (Screw) ×2 IMPLANT
SCREW SELF TAP 12M (Screw) ×2 IMPLANT
SCREW SELF TAP 22M (Screw) ×1 IMPLANT
SPONGE LAP 18X18 RF (DISPOSABLE) ×8 IMPLANT
STAPLER VISISTAT 35W (STAPLE) ×2 IMPLANT
STOCKINETTE IMPERVIOUS 9X36 MD (GAUZE/BANDAGES/DRESSINGS) IMPLANT
SUCTION FRAZIER HANDLE 10FR (MISCELLANEOUS) ×2
SUCTION TUBE FRAZIER 10FR DISP (MISCELLANEOUS) ×1 IMPLANT
SUT ETHILON 2 0 PSLX (SUTURE) ×4 IMPLANT
SUT ETHILON 3 0 PS 1 (SUTURE) IMPLANT
SUT PROLENE 2 0 CT 1 (SUTURE) ×2 IMPLANT
SUT VIC AB 0 CT1 27 (SUTURE) ×4
SUT VIC AB 0 CT1 27XBRD ANBCTR (SUTURE) ×2 IMPLANT
SUT VIC AB 1 CT1 27 (SUTURE) ×2
SUT VIC AB 1 CT1 27XBRD ANBCTR (SUTURE) ×1 IMPLANT
SUT VIC AB 2-0 CT1 27 (SUTURE) ×6
SUT VIC AB 2-0 CT1 TAPERPNT 27 (SUTURE) ×3 IMPLANT
SYR 5ML LL (SYRINGE) IMPLANT
SYR CONTROL 10ML LL (SYRINGE) IMPLANT
TOWEL GREEN STERILE (TOWEL DISPOSABLE) ×6 IMPLANT
TOWEL GREEN STERILE FF (TOWEL DISPOSABLE) ×2 IMPLANT
TRAY FOLEY MTR SLVR 16FR STAT (SET/KITS/TRAYS/PACK) IMPLANT
TUBE CONNECTING 12X1/4 (SUCTIONS) ×2 IMPLANT
WATER STERILE IRR 1000ML POUR (IV SOLUTION) IMPLANT
YANKAUER SUCT BULB TIP NO VENT (SUCTIONS) ×2 IMPLANT
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
## 2020-08-22 NOTE — Anesthesia Procedure Notes (Signed)
Procedure Name: Intubation Date/Time: 08/22/2020 1:02 PM Performed by: Modena Morrow, CRNA Pre-anesthesia Checklist: Patient identified, Emergency Drugs available, Suction available and Patient being monitored Patient Re-evaluated:Patient Re-evaluated prior to induction Oxygen Delivery Method: Circle system utilized Preoxygenation: Pre-oxygenation with 100% oxygen Induction Type: IV induction Ventilation: Mask ventilation without difficulty Laryngoscope Size: Glidescope Grade View: Grade I Tube type: Oral Tube size: 7.5 mm Number of attempts: 1 Airway Equipment and Method: Stylet and Oral airway Placement Confirmation: ETT inserted through vocal cords under direct vision,  positive ETCO2 and breath sounds checked- equal and bilateral Secured at: 23 cm Tube secured with: Tape Dental Injury: Teeth and Oropharynx as per pre-operative assessment

## 2020-08-22 NOTE — Transfer of Care (Signed)
Immediate Anesthesia Transfer of Care Note  Patient: Randy Wagner  Procedure(s) Performed: OPEN REDUCTION INTERNAL FIXATION (ORIF) DISTAL HUMERUS FRACTURE (Right Elbow)  Patient Location: PACU  Anesthesia Type:General  Level of Consciousness: drowsy and patient cooperative  Airway & Oxygen Therapy: Patient Spontanous Breathing and Patient connected to face mask oxygen  Post-op Assessment: Report given to RN and Post -op Vital signs reviewed and stable  Post vital signs: Reviewed and stable  Last Vitals:  Vitals Value Taken Time  BP 110/75 08/22/20 1719  Temp    Pulse 85 08/22/20 1720  Resp 24 08/22/20 1720  SpO2 100 % 08/22/20 1720  Vitals shown include unvalidated device data.  Last Pain:  Vitals:   08/22/20 1015  TempSrc:   PainSc: 10-Worst pain ever      Patients Stated Pain Goal: 3 (08/20/20 0931)  Complications: No complications documented.
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
## 2020-08-22 NOTE — Anesthesia Procedure Notes (Signed)
Anesthesia Regional Block: Supraclavicular block   Pre-Anesthetic Checklist: ,, timeout performed, Correct Patient, Correct Site, Correct Laterality, Correct Procedure, Correct Position, site marked, Risks and benefits discussed,  Surgical consent,  Pre-op evaluation,  At surgeon's request and post-op pain management  Laterality: Right  Prep: chloraprep       Needles:  Injection technique: Single-shot  Needle Type: Echogenic Stimulator Needle     Needle Length: 10cm  Needle Gauge: 21     Additional Needles:   Procedures:,,,, ultrasound used (permanent image in chart),,,,  Narrative:  Start time: 08/22/2020 11:25 AM End time: 08/22/2020 11:30 AM Injection made incrementally with aspirations every 5 mL.  Performed by: Personally  Anesthesiologist: Mellody Dance, MD  Additional Notes: Functioning IV was confirmed and monitors applied. Sterile prep and drape,hand hygiene and sterile gloves were used.Ultrasound guidance: relevant anatomy identified, needle position confirmed, local anesthetic spread visualized around nerve(s)., vascular puncture avoided.  Image printed for medical record.  Negative aspiration and negative test dose prior to incremental administration of local anesthetic. The patient tolerated the procedure well.

## 2020-08-22 NOTE — Anesthesia Preprocedure Evaluation (Addendum)
Anesthesia Evaluation  Patient identified by MRN, date of birth, ID band Patient awake    Reviewed: Allergy & Precautions, NPO status , Patient's Chart, lab work & pertinent test results  Airway Mallampati: III  TM Distance: >3 FB   Mouth opening: Limited Mouth Opening  Dental  (+) Teeth Intact, Dental Advisory Given   Pulmonary Current Smoker and Patient abstained from smoking.,    Pulmonary exam normal        Cardiovascular negative cardio ROS   Rhythm:Regular Rate:Normal     Neuro/Psych negative neurological ROS  negative psych ROS   GI/Hepatic negative GI ROS, Neg liver ROS,   Endo/Other  negative endocrine ROS  Renal/GU negative Renal ROS     Musculoskeletal negative musculoskeletal ROS (+)   Abdominal Normal abdominal exam  (+)   Peds negative pediatric ROS (+)  Hematology negative hematology ROS (+)   Anesthesia Other Findings   Reproductive/Obstetrics                            Anesthesia Physical  Anesthesia Plan  ASA: II  Anesthesia Plan: General   Post-op Pain Management:    Induction: Intravenous  PONV Risk Score and Plan: 2 and Ondansetron, Dexamethasone and Midazolam  Airway Management Planned: Oral ETT and Video Laryngoscope Planned  Additional Equipment: None  Intra-op Plan:   Post-operative Plan: Extubation in OR  Informed Consent: I have reviewed the patients History and Physical, chart, labs and discussed the procedure including the risks, benefits and alternatives for the proposed anesthesia with the patient or authorized representative who has indicated his/her understanding and acceptance.     Dental advisory given  Plan Discussed with: CRNA and Anesthesiologist  Anesthesia Plan Comments:        Anesthesia Quick Evaluation

## 2020-08-23 ENCOUNTER — Encounter (HOSPITAL_COMMUNITY): Payer: Self-pay | Admitting: Orthopedic Surgery

## 2020-08-23 ENCOUNTER — Inpatient Hospital Stay (HOSPITAL_COMMUNITY): Payer: Medicaid Other

## 2020-08-23 LAB — CBC
HCT: 28.2 % — ABNORMAL LOW (ref 39.0–52.0)
Hemoglobin: 9.3 g/dL — ABNORMAL LOW (ref 13.0–17.0)
MCH: 29.2 pg (ref 26.0–34.0)
MCHC: 33 g/dL (ref 30.0–36.0)
MCV: 88.7 fL (ref 80.0–100.0)
Platelets: 157 10*3/uL (ref 150–400)
RBC: 3.18 MIL/uL — ABNORMAL LOW (ref 4.22–5.81)
RDW: 12.3 % (ref 11.5–15.5)
WBC: 10.4 10*3/uL (ref 4.0–10.5)
nRBC: 0 % (ref 0.0–0.2)

## 2020-08-23 LAB — COMPREHENSIVE METABOLIC PANEL
ALT: 26 U/L (ref 0–44)
AST: 46 U/L — ABNORMAL HIGH (ref 15–41)
Albumin: 2.6 g/dL — ABNORMAL LOW (ref 3.5–5.0)
Alkaline Phosphatase: 23 U/L — ABNORMAL LOW (ref 38–126)
Anion gap: 7 (ref 5–15)
BUN: 12 mg/dL (ref 6–20)
CO2: 23 mmol/L (ref 22–32)
Calcium: 8.2 mg/dL — ABNORMAL LOW (ref 8.9–10.3)
Chloride: 110 mmol/L (ref 98–111)
Creatinine, Ser: 1.1 mg/dL (ref 0.61–1.24)
GFR, Estimated: >60 mL/min (ref >60)
Glucose, Bld: 106 mg/dL — ABNORMAL HIGH (ref 70–99)
Potassium: 3.8 mmol/L (ref 3.5–5.1)
Sodium: 140 mmol/L (ref 135–145)
Total Bilirubin: 0.9 mg/dL (ref 0.3–1.2)
Total Protein: 4.6 g/dL — ABNORMAL LOW (ref 6.5–8.1)

## 2020-08-23 LAB — VITAMIN D 25 HYDROXY (VIT D DEFICIENCY, FRACTURES): Vit D, 25-Hydroxy: 11.5 ng/mL — ABNORMAL LOW (ref 30–100)

## 2020-08-23 MED ORDER — DIPHENHYDRAMINE HCL 25 MG PO CAPS: 25.0000 mg | ORAL_CAPSULE | Freq: Four times a day (QID) | ORAL | Status: DC | PRN

## 2020-08-23 MED ORDER — BACITRACIN ZINC 500 UNIT/GM EX OINT
TOPICAL_OINTMENT | Freq: Two times a day (BID) | CUTANEOUS | Status: DC
  Filled 2020-08-23 (×2): qty 28.35

## 2020-08-23 MED ORDER — ASCORBIC ACID 500 MG PO TABS
1000.0000 mg | ORAL_TABLET | Freq: Every day | ORAL | Status: DC
  Administered 2020-08-23 – 2020-08-24 (×2): 1000 mg via ORAL
  Filled 2020-08-23 (×2): qty 2

## 2020-08-23 MED ORDER — CEPHALEXIN 500 MG PO CAPS
500.0000 mg | ORAL_CAPSULE | Freq: Three times a day (TID) | ORAL | Status: DC
  Administered 2020-08-23 – 2020-08-24 (×3): 500 mg via ORAL
  Filled 2020-08-23 (×3): qty 1

## 2020-08-23 MED ORDER — VITAMIN D 25 MCG (1000 UNIT) PO TABS
2000.0000 [IU] | ORAL_TABLET | Freq: Two times a day (BID) | ORAL | Status: DC
  Administered 2020-08-23 – 2020-08-24 (×3): 2000 [IU] via ORAL
  Filled 2020-08-23 (×3): qty 2

## 2020-08-23 MED ORDER — BACITRACIN ZINC 500 UNIT/GM EX OINT: TOPICAL_OINTMENT | Freq: Two times a day (BID) | CUTANEOUS | Status: DC

## 2020-08-23 MED ORDER — OXYCODONE HCL 5 MG PO TABS
5.0000 mg | ORAL_TABLET | ORAL | Status: DC | PRN
  Administered 2020-08-23 – 2020-08-24 (×4): 10 mg via ORAL
  Filled 2020-08-23 (×5): qty 2
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
## 2020-08-23 NOTE — Anesthesia Postprocedure Evaluation (Signed)
Anesthesia Post Note  Patient: Randy Wagner  Procedure(s) Performed: OPEN REDUCTION INTERNAL FIXATION (ORIF) DISTAL HUMERUS FRACTURE (Right Elbow)     Patient location during evaluation: PACU Anesthesia Type: General and Regional Level of consciousness: sedated Pain management: pain level controlled Vital Signs Assessment: post-procedure vital signs reviewed and stable Respiratory status: spontaneous breathing and respiratory function stable Cardiovascular status: stable Postop Assessment: no apparent nausea or vomiting Anesthetic complications: no   No complications documented.  Last Vitals:  Vitals:   08/22/20 1853 08/23/20 0622  BP: (!) 155/74 114/68  Pulse: 64 67  Resp: (!) 24 (!) 22  Temp: 37.1 C 36.5 C  SpO2: 99% 96%    Last Pain:  Vitals:   08/23/20 0622  TempSrc: Oral  PainSc: 5                  Candra R Wrenna Saks
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
## 2020-08-23 NOTE — Progress Notes (Signed)
Occupational Therapy Treatment Patient Details Name: Randy Wagner MRN: [id number] DOB: [birth year] Today's Date: 08/23/2020    History of present illness 30 yo male admitted from [facility] 08/19/20 with facial lacerations, mulitple Lft rib fxs, Bil pulmonary contusions with Lft PTX, Lft scapular fx and Rt distal humerus fx ORIF 08/22/20. PMH smoker.   OT comments  Focus of session on educating pt and wife in edema management of R UE, sling use and compensatory strategies for ADL. Educated and encouraged use of IS x 10/hour. Pt and wife verbalized understanding. Pt is eager to go home.   Follow Up Recommendations  Home health OT;Supervision - Intermittent    Equipment Recommendations  3 in 1 bedside commode;Other (comment) (urinal)    Recommendations for Other Services      Precautions / Restrictions Precautions Precautions: Fall;Other (comment) Precaution Comments: watch 02 Required Braces or Orthoses: Sling Restrictions Weight Bearing Restrictions: Yes RUE Weight Bearing: Non weight bearing       Mobility Bed Mobility Overal bed mobility: Needs Assistance Bed Mobility: Sit to Supine       Sit to supine: Min guard        Transfers                 General transfer comment: pt ambulating back from bathroom upon arrival    Balance Overall balance assessment: Mild deficits observed, not formally tested                                         ADL either performed or assessed with clinical judgement   ADL   Eating/Feeding: Minimal assistance;Sitting Eating/Feeding Details (indicate cue type and reason): issued foam build up for utensils, able to finger feed and use cup with L hand Grooming: Minimal assistance;Standing (issued foam build up for use with toothbrush)                   Toilet Transfer: Min guard           Functional mobility during ADLs: Min guard General ADL Comments: Educated pt and wife in donning and doffing sling, edema  management of R elbow (elevated above heart, pumped hand, iced), compensatory strategies for ADL and importance of IS use.     Vision       Perception     Praxis      Cognition Arousal/Alertness: Awake/alert Behavior During Therapy: Flat affect Overall Cognitive Status: Within Functional Limits for tasks assessed                                          Exercises     Shoulder Instructions       General Comments      Pertinent Vitals/ Pain       Pain Assessment: Faces Faces Pain Scale: Hurts even more Pain Location: Rt arm Pain Descriptors / Indicators: Discomfort;Grimacing Pain Intervention(s): Repositioned;Ice applied  Home Living                                          Prior Functioning/Environment              Frequency  Min 3X/week  Progress Toward Goals  OT Goals(current goals can now be found in the care plan section)  Progress towards OT goals: Progressing toward goals  Acute Rehab OT Goals Patient Stated Goal: to go home soon OT Goal Formulation: With patient Time For Goal Achievement: 09/05/20 Potential to Achieve Goals: Good  Plan Discharge plan remains appropriate    Co-evaluation                 AM-PAC OT "6 Clicks" Daily Activity     Outcome Measure   Help from another person eating meals?: A Little Help from another person taking care of personal grooming?: A Little Help from another person toileting, which includes using toliet, bedpan, or urinal?: A Little Help from another person bathing (including washing, rinsing, drying)?: A Little Help from another person to put on and taking off regular upper body clothing?: A Lot Help from another person to put on and taking off regular lower body clothing?: A Lot 6 Click Score: 16    End of Session    OT Visit Diagnosis: Unsteadiness on feet (R26.81);Muscle weakness (generalized) (M62.81);Pain Pain - Right/Left: Right Pain - part of  body: Arm   Activity Tolerance Patient tolerated treatment well   Patient Left in bed;with call bell/phone within reach;with family/visitor present   Nurse Communication          Time: 6553-7482 OT Time Calculation (min): 24 min  Charges: OT General Charges $OT Visit: 1 Visit OT Treatments $Self Care/Home Management : 8-22 mins $Therapeutic Activity: 8-22 mins  Randy Wagner, OTR/L Acute Rehabilitation Services Pager: [phone] Office: [phone]   Randy Wagner 08/23/2020, 12:04 PM

## 2020-08-23 NOTE — Progress Notes (Signed)
Physical Therapy Treatment Patient Details Name: Randy Wagner MRN: [id number] DOB: 13-Dec-1990 Today's Date: 08/23/2020    History of Present Illness 30 yo male admitted from [facility] 08/19/20 with facial lacerations, mulitple Lft rib fxs, Bil pulmonary contusions with Lft PTX, Lft scapular fx and Rt distal humerus fx ORIF 08/22/20. PMH smoker.    PT Comments    Pt progressing gt distance and performed trial on stairs.  Remains on track to return home.     Follow Up Recommendations  No PT follow up;Supervision for mobility/OOB     Equipment Recommendations  None recommended by PT    Recommendations for Other Services       Precautions / Restrictions Precautions Precautions: Fall;Other (comment) Precaution Comments: watch 02 Required Braces or Orthoses: Sling Restrictions Weight Bearing Restrictions: Yes RUE Weight Bearing: Non weight bearing    Mobility  Bed Mobility Overal bed mobility: Needs Assistance Bed Mobility: Supine to Sit     Supine to sit: Min assist     General bed mobility comments: used PTA as railing and able to rise into sitting.    Transfers Overall transfer level: Needs assistance Equipment used: None Transfers: Sit to/from Stand Sit to Stand: Min assist         General transfer comment: used PTA HHA to rise into standing.  Ambulation/Gait Ambulation/Gait assistance: Min guard Gait Distance (Feet): 350 Feet Assistive device: None Gait Pattern/deviations: Step-through pattern;Decreased step length - right;Decreased step length - left;Decreased stride length;Narrow base of support     General Gait Details: Slow, guarded gait but able to progress out of room and brief trial outside. C/o L low back pain.  Min guard to manage balance.   Stairs Stairs: Yes Stairs assistance: Min guard Stair Management: One rail Left;Forwards Number of Stairs: 10 General stair comments: Cues for sequencing and safety.  Pt with mild unsteadiness.  HHA on L to  descend stairs.   Wheelchair Mobility    Modified Rankin (Stroke Patients Only)       Balance Overall balance assessment: Mild deficits observed, not formally tested                                          Cognition Arousal/Alertness: Awake/alert Behavior During Therapy: Flat affect Overall Cognitive Status: Within Functional Limits for tasks assessed                                        Exercises      General Comments        Pertinent Vitals/Pain Pain Assessment: 0-10 Pain Score: 7  Pain Location: Rt arm Pain Descriptors / Indicators: Discomfort;Grimacing Pain Intervention(s): Monitored during session;Repositioned (placed in sling)    Home Living                      Prior Function            PT Goals (current goals can now be found in the care plan section) Acute Rehab PT Goals Patient Stated Goal: to go home soon Potential to Achieve Goals: Good Progress towards PT goals: Progressing toward goals    Frequency    Min 5X/week      PT Plan Current plan remains appropriate    Co-evaluation  AM-PAC PT "6 Clicks" Mobility   Outcome Measure  Help needed turning from your back to your side while in a flat bed without using bedrails?: A Little Help needed moving from lying on your back to sitting on the side of a flat bed without using bedrails?: A Little Help needed moving to and from a bed to a chair (including a wheelchair)?: A Little Help needed standing up from a chair using your arms (e.g., wheelchair or bedside chair)?: A Little Help needed to walk in hospital room?: A Little Help needed climbing 3-5 steps with a railing? : A Little 6 Click Score: 18    End of Session   Activity Tolerance: Patient tolerated treatment well Patient left: in chair;with call bell/phone within reach;with family/visitor present Nurse Communication: Mobility status PT Visit Diagnosis: Pain;Muscle  weakness (generalized) (M62.81);Unsteadiness on feet (R26.81);Difficulty in walking, not elsewhere classified (R26.2) Pain - Right/Left: Right Pain - part of body: Arm (back)     Time: 0254-2706 PT Time Calculation (min) (ACUTE ONLY): 44 min  Charges:  $Gait Training: 23-37 mins $Therapeutic Activity: 8-22 mins                     Bonney Leitz , PTA Acute Rehabilitation Services Pager [phone] Office [phone]     Randy Wagner 08/23/2020, 6:19 PM

## 2020-08-23 NOTE — Progress Notes (Signed)
PHARMACY NOTE:  ANTIMICROBIAL RENAL DOSAGE ADJUSTMENT  Current antimicrobial regimen includes a mismatch between antimicrobial dosage and estimated renal function.  As per policy approved by the Pharmacy & Therapeutics and Medical Executive Committees, the antimicrobial dosage will be adjusted accordingly.  Current antimicrobial dosage:  Cephalexin 500mg  Q12hr  Indication: cellulitis/facial lacerations  Renal Function:  Estimated Creatinine Clearance: 108.8 mL/min (by C-G formula based on SCr of 1.1 mg/dL).      Antimicrobial dosage has been changed to:  500mg  Q 8 hr     Thank you for allowing pharmacy to be a part of this patient's care.  , PharmD, BCPS, BCCP Clinical Pharmacist  Please check AMION for all [facility] Pharmacy phone numbers After 10:00 PM, call Main Pharmacy [phone]

## 2020-08-24 ENCOUNTER — Other Ambulatory Visit: Payer: Self-pay | Admitting: General Surgery

## 2020-08-24 MED ORDER — ASCORBIC ACID 1000 MG PO TABS: 1000.0000 mg | ORAL_TABLET | Freq: Every day | ORAL | Status: AC

## 2020-08-24 MED ORDER — OXYCODONE HCL 5 MG PO TABS: 5.0000 mg | ORAL_TABLET | ORAL | 0 refills | Status: AC | PRN

## 2020-08-24 MED ORDER — METHOCARBAMOL 500 MG PO TABS
1000.0000 mg | ORAL_TABLET | Freq: Three times a day (TID) | ORAL | 0 refills | Status: AC | PRN
Start: 2020-08-24 — End: ?

## 2020-08-24 MED ORDER — CEPHALEXIN 500 MG PO CAPS: 500.0000 mg | ORAL_CAPSULE | Freq: Three times a day (TID) | ORAL | 0 refills | Status: AC

## 2020-08-24 MED ORDER — BACITRACIN ZINC 500 UNIT/GM EX OINT: TOPICAL_OINTMENT | Freq: Two times a day (BID) | CUTANEOUS | 0 refills | Status: AC

## 2020-08-24 MED FILL — oxyCODONE HCL 5 MG TABS: 5 | 3 days supply | Qty: 30 | Fill #0

## 2020-08-24 MED FILL — SM ANTIBIOTIC 500 UNIT/GM O: 500 | 7 days supply | Qty: 28 | Fill #0

## 2020-08-24 MED FILL — CEPHALEXIN 500 MG CAPS: 500 | 6 days supply | Qty: 18 | Fill #0

## 2020-08-24 MED FILL — METHOCARBAMOL 500 MG TABS: 500 | 6 days supply | Qty: 30 | Fill #0

## 2020-08-24 NOTE — Progress Notes (Signed)
Discharge instructions (including medications) discussed with and copy provided to patient/caregiver 

## 2020-08-24 NOTE — Progress Notes (Signed)
Orthopedic Tech Progress Note Patient Details:  Randy Wagner Aug 26, 1990 [id number]  Ortho Devices Type of Ortho Device: Arm sling Ortho Device/Splint Location: replacement Ortho Device/Splint Interventions: Application   Post Interventions Patient Tolerated: Well Instructions Provided: Care of device   Saul Fordyce 08/24/2020, 10:56 AM

## 2020-08-24 NOTE — Progress Notes (Signed)
Physical Therapy Treatment Patient Details Name: Randy Wagner MRN: [id number] DOB: Apr 15, 1991 Today's Date: 08/24/2020    History of Present Illness 30 yo male admitted from [facility] 08/19/20 with facial lacerations, mulitple Lft rib fxs, Bil pulmonary contusions with Lft PTX, Lft scapular fx and Rt distal humerus fx ORIF 08/22/20. PMH smoker.    PT Comments    Pt reclined in chair on arrival this session.  Pt continues to require external assistance to mobilize due to pain.  More complaints noted in left Low back this session.  Informed trauma PA.     Follow Up Recommendations  No PT follow up;Supervision for mobility/OOB     Equipment Recommendations  None recommended by PT    Recommendations for Other Services       Precautions / Restrictions Precautions Precautions: Fall Precaution Comments: watch 02 Required Braces or Orthoses: Sling Restrictions Weight Bearing Restrictions: Yes RUE Weight Bearing: Non weight bearing    Mobility  Bed Mobility Overal bed mobility: Needs Assistance Bed Mobility: Supine to Sit           General bed mobility comments: Pt seated in recliner and required min assistance to elevate trunk from reclined back of recliner.    Transfers Overall transfer level: Needs assistance Equipment used: None Transfers: Sit to/from Stand Sit to Stand: Mod assist         General transfer comment: Increased assistance to rise into standing this session.  Ambulation/Gait Ambulation/Gait assistance: Min guard Gait Distance (Feet): 200 Feet Assistive device: None Gait Pattern/deviations: Step-through pattern;Decreased step length - right;Decreased step length - left;Decreased stride length;Narrow base of support;Drifts right/left     General Gait Details: Slow buckle on L stance phase due to pain in low back.  Pt noted with drift in gt pattern this session.   Stairs             Wheelchair Mobility    Modified Rankin (Stroke Patients Only)        Balance Overall balance assessment: Mild deficits observed, not formally tested                                          Cognition Arousal/Alertness: Awake/alert Behavior During Therapy: Flat affect Overall Cognitive Status: Within Functional Limits for tasks assessed                                 General Comments: aware of injuries location and pending surgery. able to verbalize visitors in room correctly      Exercises      General Comments        Pertinent Vitals/Pain Pain Assessment: Faces Pain Score: 7  Faces Pain Scale: Hurts a little bit Pain Location: L low back and R UE Pain Descriptors / Indicators: Discomfort;Grimacing Pain Intervention(s): Repositioned    Home Living                      Prior Function            PT Goals (current goals can now be found in the care plan section) Acute Rehab PT Goals Patient Stated Goal: to go home soon Potential to Achieve Goals: Good Progress towards PT goals: Progressing toward goals    Frequency    Min 5X/week      PT Plan  Current plan remains appropriate    Co-evaluation              AM-PAC PT "6 Clicks" Mobility   Outcome Measure  Help needed turning from your back to your side while in a flat bed without using bedrails?: A Little Help needed moving from lying on your back to sitting on the side of a flat bed without using bedrails?: A Little Help needed moving to and from a bed to a chair (including a wheelchair)?: A Little Help needed standing up from a chair using your arms (e.g., wheelchair or bedside chair)?: A Little Help needed to walk in hospital room?: A Little Help needed climbing 3-5 steps with a railing? : A Little 6 Click Score: 18    End of Session   Activity Tolerance: Patient tolerated treatment well Patient left: with family/visitor present;in bed;with bed alarm set;with call bell/phone within reach Nurse Communication:  Mobility status PT Visit Diagnosis: Pain;Muscle weakness (generalized) (M62.81);Unsteadiness on feet (R26.81);Difficulty in walking, not elsewhere classified (R26.2) Pain - Right/Left: Right Pain - part of body: Arm (back)     Time: 1610-9604 PT Time Calculation (min) (ACUTE ONLY): 19 min  Charges:  $Gait Training: 8-22 mins                     Bonney Leitz , PTA Acute Rehabilitation Services Pager [phone] Office [phone]     Marshell Rieger Artis Delay 08/24/2020, 3:23 PM

## 2020-08-24 NOTE — TOC Transition Note (Signed)
Transition of Care [facility]) - CM/SW Discharge Note   Patient Details  Name: Randy Wagner MRN: [id number] Date of Birth: [birth year]  Transition of Care [facility]) CM/SW Contact:  Glennon Mac, RN Phone Number: 08/24/2020, 10:01 AM   Clinical Narrative:   Pt medically stable for discharge home today with wife.  OP OT referral has been made, per recommendations.  Pt is uninsured, but is eligible for medication assistance through [facility] program. DC Rx sent to [facility] pharmacy to be filled using MATCH letter.    Final next level of care: OP Rehab Barriers to Discharge: Barriers Resolved                       Discharge Plan and Services   Discharge Planning Services: CM [facility],Medication Assistance                                 Social Determinants of Health (SDOH) Interventions     Readmission Risk Interventions No flowsheet data found.  Quintella Baton, RN, BSN  Trauma/Neuro ICU Case Manager [phone]
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
## 2020-08-24 NOTE — Plan of Care (Signed)
  Problem: Education: Goal: Knowledge of General Education information will improve Description: Including pain rating scale, medication(s)/side effects and non-pharmacologic comfort measures Outcome: Adequate for Discharge   
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
## 2021-03-31 ENCOUNTER — Other Ambulatory Visit: Payer: Self-pay

## 2021-03-31 ENCOUNTER — Emergency Department (HOSPITAL_COMMUNITY)
Admission: EM | Admit: 2021-03-31 | Discharge: 2021-03-31 | Disposition: A | Discharge status: Home / Self Care | Payer: Medicaid Other | Attending: Emergency Medicine | Admitting: Emergency Medicine

## 2021-03-31 ENCOUNTER — Ambulatory Visit (HOSPITAL_COMMUNITY)
Admission: EM | Admit: 2021-03-31 | Discharge: 2021-03-31 | Disposition: A | Discharge status: Home / Self Care | Payer: Self-pay

## 2021-03-31 ENCOUNTER — Encounter (HOSPITAL_COMMUNITY): Payer: Self-pay | Admitting: Emergency Medicine

## 2021-03-31 DIAGNOSIS — Z113 Encounter for screening for infections with a predominantly sexual mode of transmission: Secondary | ICD-10-CM

## 2021-03-31 DIAGNOSIS — N4889 Other specified disorders of penis: Secondary | ICD-10-CM | POA: Diagnosis present

## 2021-03-31 DIAGNOSIS — F17210 Nicotine dependence, cigarettes, uncomplicated: Secondary | ICD-10-CM | POA: Insufficient documentation

## 2021-03-31 DIAGNOSIS — Z202 Contact with and (suspected) exposure to infections with a predominantly sexual mode of transmission: Secondary | ICD-10-CM | POA: Diagnosis not present

## 2021-03-31 LAB — URINALYSIS, ROUTINE W REFLEX MICROSCOPIC
Bilirubin Urine: NEGATIVE
Glucose, UA: NEGATIVE mg/dL
Hgb urine dipstick: NEGATIVE
Ketones, ur: NEGATIVE mg/dL
Leukocytes,Ua: NEGATIVE
Nitrite: NEGATIVE
Protein, ur: NEGATIVE mg/dL
Specific Gravity, Urine: 1.024 (ref 1.005–1.030)
pH: 5 (ref 5.0–8.0)

## 2021-03-31 NOTE — ED Provider Notes (Signed)
[facility] EMERGENCY DEPARTMENT Provider Note   CSN: [id number] Arrival date & time: 03/31/21  0900     History Chief Complaint  Patient presents with   Penis Pain    Randy Wagner is a 30 y.o. male.  30 year old male presents with concern for STD, states that he was going to go to urgent care however they were not open yet this morning so he came to the emergency room.  Reports unprotected intercourse 2 weeks ago, denies discharge, lesions, fever or any other complaints or concerns.      History reviewed. No pertinent past medical history.  Patient Active Problem List   Diagnosis Date Noted   Trauma 08/20/2020    Past Surgical History:  Procedure Laterality Date   LACERATION REPAIR Right 08/20/2020   Procedure: REPAIR  LACERATIONS, FOREHEAD;  Surgeon: Lovena Neighbours, MD;  Location: [facility] OR;  Service: Oral Surgery;  Laterality: Right;   NO PAST SURGERIES     ORIF HUMERUS FRACTURE Right 08/22/2020   Procedure: OPEN REDUCTION INTERNAL FIXATION (ORIF) DISTAL HUMERUS FRACTURE;  Surgeon: Myrene Galas, MD;  Location: [facility] OR;  Service: Orthopedics;  Laterality: Right;       No family history on file.  Social History   Tobacco Use   Smoking status: Every Day    Types: Cigarettes   Smokeless tobacco: Never  Vaping Use   Vaping Use: Never used  Substance Use Topics   Alcohol use: Yes   Drug use: Never    Types: Marijuana    Comment: denies    Home Medications Prior to Admission medications   Medication Sig Start Date End Date Taking? Authorizing Provider  acetaminophen (TYLENOL) 500 MG tablet Take 1,000 mg by mouth every 6 (six) hours as needed.    [provider]  ascorbic acid (VITAMIN C) 1000 MG tablet Take 1 tablet (1,000 mg total) by mouth daily. 08/24/20   Barnetta Chapel, PA-C  bacitracin ointment Apply topically 2 (two) times daily. 08/24/20   Barnetta Chapel, PA-C  bacitracin ointment APPLY TOPICALLY TWO TIMES DAILY. 08/24/20  08/24/21  Barnetta Chapel, PA-C  cephALEXin (KEFLEX) 500 MG capsule TAKE 1 CAPSULE (500 MG TOTAL) BY MOUTH EVERY EIGHT HOURS FOR 6 DAYS. 08/24/20 08/24/21  Barnetta Chapel, PA-C  ibuprofen (ADVIL) 800 MG tablet Take 1 tablet (800 mg total) by mouth every 6 (six) hours as needed. 08/03/19   Lawyer, Cristal Deer, PA-C  methocarbamol (ROBAXIN) 500 MG tablet Take 2 tablets (1,000 mg total) by mouth every 8 (eight) hours as needed for muscle spasms. 08/24/20   Barnetta Chapel, PA-C  methocarbamol (ROBAXIN) 500 MG tablet TAKE 2 TABLETS (1,000 MG TOTAL) BY MOUTH EVERY EIGHT HOURS AS NEEDED FOR MUSCLE SPASMS. 08/24/20 08/24/21  Barnetta Chapel, PA-C  oxyCODONE (OXY IR/ROXICODONE) 5 MG immediate release tablet Take 1-2 tablets (5-10 mg total) by mouth every 4 (four) hours as needed for moderate pain. 08/24/20   Barnetta Chapel, PA-C  penicillin v potassium (VEETID) 500 MG tablet Take 1 tablet (500 mg total) by mouth 4 (four) times daily. 08/03/19   Lawyer, Cristal Deer, PA-C  traMADol (ULTRAM) 50 MG tablet Take 1 tablet (50 mg total) by mouth every 6 (six) hours as needed for severe pain. 08/03/19   Charlestine Night, PA-C    Allergies    Patient has no known allergies.  Review of Systems   Review of Systems  Constitutional:  Negative for fever.  Genitourinary:  Negative for dysuria, genital sores, penile discharge, penile pain, penile  swelling, scrotal swelling and testicular pain.  Musculoskeletal:  Negative for arthralgias and myalgias.  Skin:  Negative for rash and wound.  Allergic/Immunologic: Negative for immunocompromised state.  Hematological:  Negative for adenopathy.   Physical Exam Updated Vital Signs BP (!) 138/117 (BP Location: Left Arm)   Pulse 61   Temp 97.8 F (36.6 C) (Oral)   Resp 17   SpO2 100%   Physical Exam Vitals and nursing note reviewed.  Constitutional:      General: He is not in acute distress.    Appearance: He is well-developed. He is not diaphoretic.  HENT:     Head: Normocephalic  and atraumatic.  Pulmonary:     Effort: Pulmonary effort is normal.  Genitourinary:    Comments: Deferred Neurological:     Mental Status: He is alert and oriented to person, place, and time.  Psychiatric:        Behavior: Behavior normal.    ED Results / Procedures / Treatments   Labs (all labs ordered are listed, but only abnormal results are displayed) Labs Reviewed  URINALYSIS, ROUTINE W REFLEX MICROSCOPIC  GC/CHLAMYDIA PROBE AMP () NOT AT [facility]    EKG None  Radiology No results found.  Procedures Procedures   Medications Ordered in ED Medications - No data to display  ED Course  I have reviewed the triage vital signs and the nursing notes.  Pertinent labs & imaging results that were available during my care of the patient were reviewed by me and considered in my medical decision making (see chart for details).  Clinical Course as of 03/31/21 1145  Sat Mar 31, 2021  [id number] 30 year old male presents for STD testing without symptoms otherwise.  Urine has been collected, urinalysis is unremarkable.  Advised patient to follow-up in his MyChart account for his results in 3 to 5 days present to the health department should he need any testing.  Patient is given information on free testing sites in the community. [LM]  1145 BP rechecked and improved prior to discharge. [LM]    Clinical Course User Index [LM] Alden Hipp   MDM Rules/Calculators/A&P                            Final Clinical Impression(s) / ED Diagnoses Final diagnoses:  Screening for STD (sexually transmitted disease)    Rx / DC Orders ED Discharge Orders     None        Jeannie Fend, PA-C 03/31/21 1145    Gerhard Munch, MD 03/31/21 [phone]

## 2021-03-31 NOTE — Discharge Instructions (Addendum)
Follow-up with your MyChart account in 3 to 5 days for your test results.  If any of your tests are positive, you should go to the health department for treatment.  He can also go to the health department for HIV and syphilis testing.

## 2021-03-31 NOTE — ED Provider Notes (Signed)
Emergency Medicine Provider Triage Evaluation Note  Randy Wagner , a 30 y.o. male  was evaluated in triage.  Pt complains of penile pain that began 2 days ago.  States he is here "to get tested for STDs ", tried to go to urgent care however they do not open until 10 AM this morning.  Last sexual intercourse unprotected about 2 weeks ago.  Without any penile discharge, fever, open sores.  Review of Systems  Positive: Penile pain Negative: Fever, penile discharge, wounds  Physical Exam  There were no vitals taken for this visit. Gen:   Awake, no distress   Resp:  Normal effort  MSK:   Moves extremities without difficulty  Other:  GU exam deferred per patient  Medical Decision Making  Medically screening exam initiated at 9:06 AM.  Appropriate orders placed.  [name] was informed that the remainder of the evaluation will be completed by another provider, this initial triage assessment does not replace that evaluation, and the importance of remaining in the ED until their evaluation is complete.  Patient here with penile pain that he rates about a 2, tried to go to urgent care however they "do not open until 10 AM ", asymptomatic at this time.   [name], PA-C 03/31/21 0712    Gerhard Munch, MD 03/31/21 [phone]

## 2021-03-31 NOTE — ED Triage Notes (Signed)
C/o penile pain x 2 days.  Denies sores or discharge.

## 2021-04-02 LAB — GC/CHLAMYDIA PROBE AMP (~~LOC~~) NOT AT ARMC
Chlamydia: NEGATIVE
Comment: NEGATIVE
Comment: NORMAL
Neisseria Gonorrhea: NEGATIVE

## 2021-04-12 ENCOUNTER — Encounter (HOSPITAL_COMMUNITY): Payer: Self-pay | Admitting: Urgent Care

## 2021-04-12 ENCOUNTER — Telehealth (HOSPITAL_COMMUNITY): Payer: Self-pay | Admitting: Emergency Medicine

## 2021-04-12 ENCOUNTER — Ambulatory Visit (HOSPITAL_COMMUNITY)
Admission: EM | Admit: 2021-04-12 | Discharge: 2021-04-12 | Disposition: A | Discharge status: Home / Self Care | Payer: Self-pay

## 2021-04-12 ENCOUNTER — Other Ambulatory Visit: Payer: Self-pay

## 2021-04-12 ENCOUNTER — Ambulatory Visit (HOSPITAL_COMMUNITY)
Admission: EM | Admit: 2021-04-12 | Discharge: 2021-04-12 | Disposition: A | Discharge status: Home / Self Care | Payer: Medicaid Other | Attending: Urgent Care | Admitting: Urgent Care

## 2021-04-12 DIAGNOSIS — R350 Frequency of micturition: Secondary | ICD-10-CM | POA: Insufficient documentation

## 2021-04-12 DIAGNOSIS — Z7251 High risk heterosexual behavior: Secondary | ICD-10-CM | POA: Insufficient documentation

## 2021-04-12 DIAGNOSIS — F1010 Alcohol abuse, uncomplicated: Secondary | ICD-10-CM | POA: Diagnosis present

## 2021-04-12 NOTE — ED Provider Notes (Signed)
[facility]   MRN: [id number] DOB: July 06, 1990  Subjective:   Randy Wagner is a 30 y.o. male presenting for 1 week history of persistent urinary frequency, penile tingling.  Patient had unprotected sex 1 to 2 weeks ago and would like to be checked for sexually transmitted infections.  This was done already through [facility] but patient wants to make sure.  Would like HIV and syphilis testing too. Denies dysuria, hematuria, urinary frequency, penile discharge, penile swelling, testicular pain, testicular swelling, anal pain, groin pain.  Does not drink water.  States that he drinks 1/5 of liquor almost daily as well as sodas.  No current facility-administered medications for this encounter.  Current Outpatient Medications:    acetaminophen (TYLENOL) 500 MG tablet, Take 1,000 mg by mouth every 6 (six) hours as needed., Disp: , Rfl:    ascorbic acid (VITAMIN C) 1000 MG tablet, Take 1 tablet (1,000 mg total) by mouth daily., Disp: , Rfl:    bacitracin ointment, Apply topically 2 (two) times daily., Disp: 120 g, Rfl: 0   bacitracin ointment, APPLY TOPICALLY TWO TIMES DAILY., Disp: 2 g, Rfl: 0   cephALEXin (KEFLEX) 500 MG capsule, TAKE 1 CAPSULE (500 MG TOTAL) BY MOUTH EVERY EIGHT HOURS FOR 6 DAYS., Disp: 18 capsule, Rfl: 0   ibuprofen (ADVIL) 800 MG tablet, Take 1 tablet (800 mg total) by mouth every 6 (six) hours as needed., Disp: 21 tablet, Rfl: 0   methocarbamol (ROBAXIN) 500 MG tablet, Take 2 tablets (1,000 mg total) by mouth every 8 (eight) hours as needed for muscle spasms., Disp: 30 tablet, Rfl: 0   methocarbamol (ROBAXIN) 500 MG tablet, TAKE 2 TABLETS (1,000 MG TOTAL) BY MOUTH EVERY EIGHT HOURS AS NEEDED FOR MUSCLE SPASMS., Disp: 30 tablet, Rfl: 0   oxyCODONE (OXY IR/ROXICODONE) 5 MG immediate release tablet, Take 1-2 tablets (5-10 mg total) by mouth every 4 (four) hours as needed for moderate pain., Disp: 30 tablet, Rfl: 0   penicillin v potassium (VEETID) 500 MG tablet, Take  1 tablet (500 mg total) by mouth 4 (four) times daily., Disp: 28 tablet, Rfl: 0   traMADol (ULTRAM) 50 MG tablet, Take 1 tablet (50 mg total) by mouth every 6 (six) hours as needed for severe pain., Disp: 15 tablet, Rfl: 0   No Known Allergies  No past medical history on file.   Past Surgical History:  Procedure Laterality Date   LACERATION REPAIR Right 08/20/2020   Procedure: REPAIR  LACERATIONS, FOREHEAD;  Surgeon: Lovena Neighbours, MD;  Location: [facility] OR;  Service: Oral Surgery;  Laterality: Right;   NO PAST SURGERIES     ORIF HUMERUS FRACTURE Right 08/22/2020   Procedure: OPEN REDUCTION INTERNAL FIXATION (ORIF) DISTAL HUMERUS FRACTURE;  Surgeon: Myrene Galas, MD;  Location: [facility] OR;  Service: Orthopedics;  Laterality: Right;    No family history on file.  Social History   Tobacco Use   Smoking status: Every Day    Types: Cigarettes   Smokeless tobacco: Never  Vaping Use   Vaping Use: Never used  Substance Use Topics   Alcohol use: Yes   Drug use: Never    Types: Marijuana    Comment: denies    ROS   Objective:   Vitals: BP 124/78 (BP Location: Left Arm)   Pulse 62   Temp 98 F (36.7 C) (Oral)   Resp 16   SpO2 100%   Physical Exam Constitutional:      General: He is not  in acute distress.    Appearance: Normal appearance. He is well-developed and normal weight. He is not ill-appearing, toxic-appearing or diaphoretic.  HENT:     Head: Normocephalic and atraumatic.     Right Ear: External ear normal.     Left Ear: External ear normal.     Nose: Nose normal.     Mouth/Throat:     Pharynx: Oropharynx is clear.  Eyes:     General: No scleral icterus.       Right eye: No discharge.        Left eye: No discharge.     Extraocular Movements: Extraocular movements intact.     Pupils: Pupils are equal, round, and reactive to light.  Cardiovascular:     Rate and Rhythm: Normal rate.  Pulmonary:     Effort: Pulmonary effort is normal.  Genitourinary:     Penis: Circumcised. No phimosis, paraphimosis, hypospadias, erythema, tenderness, discharge, swelling or lesions.   Musculoskeletal:     Cervical back: Normal range of motion.  Skin:    General: Skin is warm and dry.  Neurological:     Mental Status: He is alert and oriented to person, place, and time.  Psychiatric:        Mood and Affect: Mood normal.        Behavior: Behavior normal.        Thought Content: Thought content normal.        Judgment: Judgment normal.    Assessment and Plan :   PDMP not reviewed this encounter.  1. Urinary frequency   2. Alcohol abuse   3. Unprotected sex     I suspect that the primary source of patient's symptoms is the quantity of alcohol that he drinks, the sodas and lack of hydrating well.  Discussed this at length with him and he is agreeable to cut back.  Recommended stopping sodas as well.  Hydrate with water.  STI check pending.  We attempted to obtain blood work for comprehensive metabolic panel for liver enzyme check but unfortunately due to oversight the label was not printed and therefore the tubes were not drawn.  The other blood work was however.  We will attempt to contact patient until we can get in touch with him to have the blood work done for the comprehensive metabolic panel.   Wallis Bamberg, [place] 04/12/21 [phone]

## 2021-04-12 NOTE — Discharge Instructions (Addendum)
This week focus on cutting back your alcohol use by half. Then in 1 week, cut that by half again. Continue this pattern until you are no longer drinking but the equivalent of 2 beers or 2 glasses of wine or 2 shots of liquor. If you cannot hold yourself to this then quit drinking alcohol all together. Stop drinking sodas completely. Drink 64 ounces of water daily.

## 2021-04-12 NOTE — ED Triage Notes (Signed)
Pt reports burning in the penis x 5-6 days. Denies penile discharge. Pt requested STD's test

## 2021-04-12 NOTE — Telephone Encounter (Signed)
Left a VM for patient to return for more blood work.

## 2021-04-13 LAB — CYTOLOGY, (ORAL, ANAL, URETHRAL) ANCILLARY ONLY
Chlamydia: NEGATIVE
Comment: NEGATIVE
Comment: NEGATIVE
Comment: NORMAL
Neisseria Gonorrhea: NEGATIVE
Trichomonas: NEGATIVE

## 2021-04-21 ENCOUNTER — Ambulatory Visit (HOSPITAL_COMMUNITY)
Admission: EM | Admit: 2021-04-21 | Discharge: 2021-04-21 | Disposition: A | Discharge status: Home / Self Care | Payer: Medicaid Other | Attending: Medical Oncology | Admitting: Medical Oncology

## 2021-04-21 DIAGNOSIS — Z114 Encounter for screening for human immunodeficiency virus [HIV]: Secondary | ICD-10-CM | POA: Diagnosis not present

## 2021-04-21 DIAGNOSIS — Z202 Contact with and (suspected) exposure to infections with a predominantly sexual mode of transmission: Secondary | ICD-10-CM | POA: Diagnosis present

## 2021-04-21 LAB — HIV ANTIBODY (ROUTINE TESTING W REFLEX): HIV Screen 4th Generation wRfx: NONREACTIVE

## 2021-04-21 LAB — COMPREHENSIVE METABOLIC PANEL
ALT: 12 U/L (ref 0–44)
AST: 22 U/L (ref 15–41)
Albumin: 4.1 g/dL (ref 3.5–5.0)
Alkaline Phosphatase: 47 U/L (ref 38–126)
Anion gap: 6 (ref 5–15)
BUN: 14 mg/dL (ref 6–20)
CO2: 28 mmol/L (ref 22–32)
Calcium: 9.3 mg/dL (ref 8.9–10.3)
Chloride: 107 mmol/L (ref 98–111)
Creatinine, Ser: 1.16 mg/dL (ref 0.61–1.24)
GFR, Estimated: >60 mL/min (ref >60)
Glucose, Bld: 91 mg/dL (ref 70–99)
Potassium: 4.2 mmol/L (ref 3.5–5.1)
Sodium: 141 mmol/L (ref 135–145)
Total Bilirubin: 0.4 mg/dL (ref 0.3–1.2)
Total Protein: 6.8 g/dL (ref 6.5–8.1)

## 2021-04-21 NOTE — ED Triage Notes (Signed)
Patient came by clinic for review of his labs from 10/20 visit.  Unfortunately, it appears they were d/c'd by lab.  After review with Maralyn Sago, APP she okay'd redraw on same labs.

## 2021-04-22 LAB — RPR: RPR Ser Ql: NONREACTIVE

## 2021-05-23 ENCOUNTER — Other Ambulatory Visit: Payer: Self-pay

## 2021-05-23 ENCOUNTER — Encounter (HOSPITAL_COMMUNITY): Payer: Self-pay

## 2021-05-23 ENCOUNTER — Ambulatory Visit (HOSPITAL_COMMUNITY)
Admission: EM | Admit: 2021-05-23 | Discharge: 2021-05-23 | Disposition: A | Discharge status: Home / Self Care | Payer: Medicaid Other

## 2021-05-23 DIAGNOSIS — K1370 Unspecified lesions of oral mucosa: Secondary | ICD-10-CM | POA: Diagnosis not present

## 2021-05-23 NOTE — ED Provider Notes (Signed)
Randy Wagner    CSN: [id number] Arrival date & time: 05/23/21  1855      History   Chief Complaint Chief Complaint  Patient presents with   Mouth Lesions    HPI Randy Wagner is a 30 y.o. male.   HPI Patient presents for evaluation of red bump back of his mouth. The bump was evaluated at the health department and patient was advised lesions was non concerning for STD. He is in today for second opinion. He also complains of non-specific interment tingling of his penis. No itching, discharge or lesions. Patient seen recently for same concern and had negative STD cytology  History reviewed. No pertinent past medical history.  Patient Active Problem List   Diagnosis Date Noted   Trauma 08/20/2020    Past Surgical History:  Procedure Laterality Date   LACERATION REPAIR Right 08/20/2020   Procedure: REPAIR  LACERATIONS, FOREHEAD;  Surgeon: Ronal Fear, MD;  Location: [place];  Service: Oral Surgery;  Laterality: Right;   NO PAST SURGERIES     ORIF HUMERUS FRACTURE Right 08/22/2020   Procedure: OPEN REDUCTION INTERNAL FIXATION (ORIF) DISTAL HUMERUS FRACTURE;  Surgeon: Altamese Joaquin, MD;  Location: [place];  Service: Orthopedics;  Laterality: Right;       Home Medications    Prior to Admission medications   Medication Sig Start Date End Date Taking? Authorizing Provider  acetaminophen (TYLENOL) 500 MG tablet Take 1,000 mg by mouth every 6 (six) hours as needed.    [provider]  ascorbic acid (VITAMIN C) 1000 MG tablet Take 1 tablet (1,000 mg total) by mouth daily. 08/24/20   [name], PA-C  bacitracin ointment Apply topically 2 (two) times daily. 08/24/20   [name], PA-C  bacitracin ointment APPLY TOPICALLY TWO TIMES DAILY. 08/24/20 08/24/21  [name], PA-C  cephALEXin (KEFLEX) 500 MG capsule TAKE 1 CAPSULE (500 MG TOTAL) BY MOUTH EVERY EIGHT HOURS FOR 6 DAYS. 08/24/20 08/24/21  [name], PA-C  ibuprofen (ADVIL) 800 MG tablet Take 1  tablet (800 mg total) by mouth every 6 (six) hours as needed. 08/03/19   Lawyer, [name], PA-C  methocarbamol (ROBAXIN) 500 MG tablet Take 2 tablets (1,000 mg total) by mouth every 8 (eight) hours as needed for muscle spasms. 08/24/20   [name], PA-C  methocarbamol (ROBAXIN) 500 MG tablet TAKE 2 TABLETS (1,000 MG TOTAL) BY MOUTH EVERY EIGHT HOURS AS NEEDED FOR MUSCLE SPASMS. 08/24/20 08/24/21  [name], PA-C  oxyCODONE (OXY IR/ROXICODONE) 5 MG immediate release tablet Take 1-2 tablets (5-10 mg total) by mouth every 4 (four) hours as needed for moderate pain. 08/24/20   [name], PA-C  penicillin v potassium (VEETID) 500 MG tablet Take 1 tablet (500 mg total) by mouth 4 (four) times daily. 08/03/19   Lawyer, [name], PA-C  traMADol (ULTRAM) 50 MG tablet Take 1 tablet (50 mg total) by mouth every 6 (six) hours as needed for severe pain. 08/03/19   [name], PA-C    Family History History reviewed. No pertinent family history.  Social History Social History   Tobacco Use   Smoking status: Every Day    Types: Cigarettes   Smokeless tobacco: Never  Vaping Use   Vaping Use: Never used  Substance Use Topics   Alcohol use: Yes   Drug use: Never    Types: Marijuana    Comment: denies     Allergies   Patient has no known allergies.   Review of Systems Review of  Systems Pertinent negatives listed in HPI  Physical Exam Triage Vital Signs ED Triage Vitals  Enc Vitals Group     BP 05/23/21 1946 116/72     Pulse Rate 05/23/21 1946 65     Resp 05/23/21 1946 18     Temp 05/23/21 1946 98.1 F (36.7 C)     Temp Source 05/23/21 1946 Oral     SpO2 05/23/21 1946 100 %     Weight --      Height --      Head Circumference --      Peak Flow --      Pain Score 05/23/21 1945 0     Pain Loc --      Pain Edu? --      Excl. in GC? --    No data found.  Updated Vital Signs BP 116/72 (BP Location: Right Arm)   Pulse 65   Temp 98.1 F (36.7 C) (Oral)   Resp 18    SpO2 100%   Visual Acuity Right Eye Distance:   Left Eye Distance:   Bilateral Distance:    Right Eye Near:   Left Eye Near:    Bilateral Near:     Physical Exam Constitutional:      Appearance: Normal appearance.  HENT:     Head: Normocephalic and atraumatic.     Nose: Nose normal.     Mouth/Throat:     Mouth: Mucous membranes are moist.     Palate: Lesions present.     Pharynx: No oropharyngeal exudate or posterior oropharyngeal erythema.      Comments: 1/2 cm diameter erythematous papular present along the left upper palate  Eyes:     Extraocular Movements: Extraocular movements intact.     Pupils: Pupils are equal, round, and reactive to light.  Cardiovascular:     Rate and Rhythm: Normal rate.  Pulmonary:     Effort: Pulmonary effort is normal.  Neurological:     General: No focal deficit present.     Mental Status: He is alert.  Psychiatric:        Mood and Affect: Mood normal.     UC Treatments / Results  Labs (all labs ordered are listed, but only abnormal results are displayed) Labs Reviewed - No data to display  EKG   Radiology No results found.  Procedures Procedures (including critical care time)  Medications Ordered in UC Medications - No data to display  Initial Impression / Assessment and Plan / UC Course  I have reviewed the triage vital signs and the nursing notes.  Pertinent labs & imaging results that were available during my care of the patient were reviewed by me and considered in my medical decision making (see chart for details).     Oral lesion, benign, appearance is that if a small papule "blood blister" isolated Oropharynx grossly unremarkable. No additional tx warranted. Suggested urology follow-up of non-specific tingling of penis if this continues to be worrisome. Reassurance provided.  Final Clinical Impressions(s) / UC Diagnoses   Final diagnoses:  Oral lesion     Discharge Instructions      The blister in your  mouth is not consistent with that of a herpetic lesion.  It appears to be a blood blister that can occur for multiple reasons including brushing too hard during dental care or even eating hot foods or eating foods is irritating to the gums.  The other area that she question the mouth appears to be that  from chewing or biting the inner part of your jaw.  Your exam is unremarkable today.   I have also included information for you to follow-up with a urologist if you continue to have the uncomfortable feeling with urinating.     ED Prescriptions   None    PDMP not reviewed this encounter.   Scot Jun, FNP 05/29/21 1422

## 2021-05-23 NOTE — Discharge Instructions (Signed)
The blister in your mouth is not consistent with that of a herpetic lesion.  It appears to be a blood blister that can occur for multiple reasons including brushing too hard during dental care or even eating hot foods or eating foods is irritating to the gums.  The other area that she question the mouth appears to be that from chewing or biting the inner part of your jaw.  Your exam is unremarkable today.   I have also included information for you to follow-up with a urologist if you continue to have the uncomfortable feeling with urinating.

## 2021-05-23 NOTE — ED Triage Notes (Signed)
Pt presents with mouth lesion that he states is not painful and he is not sure how long its been there.

## 2021-06-08 ENCOUNTER — Encounter (HOSPITAL_COMMUNITY): Payer: Self-pay

## 2021-06-08 ENCOUNTER — Emergency Department (HOSPITAL_COMMUNITY)
Admission: EM | Admit: 2021-06-08 | Discharge: 2021-06-08 | Disposition: A | Discharge status: Home / Self Care | Payer: Medicaid Other | Attending: Emergency Medicine | Admitting: Emergency Medicine

## 2021-06-08 DIAGNOSIS — F17210 Nicotine dependence, cigarettes, uncomplicated: Secondary | ICD-10-CM | POA: Diagnosis not present

## 2021-06-08 DIAGNOSIS — R1084 Generalized abdominal pain: Secondary | ICD-10-CM | POA: Insufficient documentation

## 2021-06-08 DIAGNOSIS — R300 Dysuria: Secondary | ICD-10-CM | POA: Diagnosis present

## 2021-06-08 LAB — COMPREHENSIVE METABOLIC PANEL
ALT: 10 U/L (ref 0–44)
AST: 14 U/L — ABNORMAL LOW (ref 15–41)
Albumin: 4 g/dL (ref 3.5–5.0)
Alkaline Phosphatase: 38 U/L (ref 38–126)
Anion gap: 8 (ref 5–15)
BUN: 10 mg/dL (ref 6–20)
CO2: 28 mmol/L (ref 22–32)
Calcium: 9.2 mg/dL (ref 8.9–10.3)
Chloride: 103 mmol/L (ref 98–111)
Creatinine, Ser: 1.07 mg/dL (ref 0.61–1.24)
GFR, Estimated: >60 mL/min (ref >60)
Glucose, Bld: 127 mg/dL — ABNORMAL HIGH (ref 70–99)
Potassium: 3.5 mmol/L (ref 3.5–5.1)
Sodium: 139 mmol/L (ref 135–145)
Total Bilirubin: 0.7 mg/dL (ref 0.3–1.2)
Total Protein: 6.1 g/dL — ABNORMAL LOW (ref 6.5–8.1)

## 2021-06-08 LAB — URINALYSIS, ROUTINE W REFLEX MICROSCOPIC
Bilirubin Urine: NEGATIVE
Glucose, UA: NEGATIVE mg/dL
Hgb urine dipstick: NEGATIVE
Ketones, ur: NEGATIVE mg/dL
Leukocytes,Ua: NEGATIVE
Nitrite: NEGATIVE
Protein, ur: NEGATIVE mg/dL
Specific Gravity, Urine: 1.02 (ref 1.005–1.030)
pH: 5.5 (ref 5.0–8.0)

## 2021-06-08 LAB — CBC WITH DIFFERENTIAL/PLATELET
Abs Immature Granulocytes: 0.01 10*3/uL (ref 0.00–0.07)
Basophils Absolute: 0.1 10*3/uL (ref 0.0–0.1)
Basophils Relative: 1 %
Eosinophils Absolute: 0.2 10*3/uL (ref 0.0–0.5)
Eosinophils Relative: 3 %
HCT: 44.5 % (ref 39.0–52.0)
Hemoglobin: 14.8 g/dL (ref 13.0–17.0)
Immature Granulocytes: 0 %
Lymphocytes Relative: 43 %
Lymphs Abs: 3.4 10*3/uL (ref 0.7–4.0)
MCH: 29.6 pg (ref 26.0–34.0)
MCHC: 33.3 g/dL (ref 30.0–36.0)
MCV: 89 fL (ref 80.0–100.0)
Monocytes Absolute: 0.5 10*3/uL (ref 0.1–1.0)
Monocytes Relative: 7 %
Neutro Abs: 3.7 10*3/uL (ref 1.7–7.7)
Neutrophils Relative %: 46 %
Platelets: 255 10*3/uL (ref 150–400)
RBC: 5 MIL/uL (ref 4.22–5.81)
RDW: 12.3 % (ref 11.5–15.5)
WBC: 7.9 10*3/uL (ref 4.0–10.5)
nRBC: 0 % (ref 0.0–0.2)

## 2021-06-08 LAB — LIPASE, BLOOD: Lipase: 31 U/L (ref 11–51)

## 2021-06-08 MED ORDER — DOXYCYCLINE HYCLATE 100 MG PO CAPS: 100.0000 mg | ORAL_CAPSULE | Freq: Two times a day (BID) | ORAL | 0 refills | Status: AC

## 2021-06-08 NOTE — ED Notes (Signed)
E-signature pad unavailable at time of pt discharge. This RN discussed discharge materials with pt and answered all pt questions. Pt stated understanding of discharge material. ? ?
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
## 2021-06-08 NOTE — ED Triage Notes (Signed)
Pt reports that he is having abd pain and also wants a STD check

## 2021-06-08 NOTE — Discharge Instructions (Signed)
Follow-up with [facility] for your burning in your penis and also follow-up with [facility] doctors on [facility] for your swelling in your tongue and neck

## 2021-07-22 ENCOUNTER — Encounter (HOSPITAL_COMMUNITY): Payer: Self-pay

## 2021-07-22 ENCOUNTER — Other Ambulatory Visit: Payer: Self-pay

## 2021-07-22 ENCOUNTER — Emergency Department (HOSPITAL_COMMUNITY)
Admission: EM | Admit: 2021-07-22 | Discharge: 2021-07-23 | Disposition: A | Discharge status: Home / Self Care | Payer: Medicaid Other | Attending: Emergency Medicine | Admitting: Emergency Medicine

## 2021-07-22 DIAGNOSIS — R002 Palpitations: Secondary | ICD-10-CM | POA: Insufficient documentation

## 2021-07-22 DIAGNOSIS — R531 Weakness: Secondary | ICD-10-CM | POA: Insufficient documentation

## 2021-07-22 DIAGNOSIS — R202 Paresthesia of skin: Secondary | ICD-10-CM | POA: Diagnosis not present

## 2021-07-22 NOTE — ED Triage Notes (Signed)
Pt presents to the ED from home with complaints of burning and tingling in lower legs and bilateral arms onset 1 month ago. Pt denies recent injury or heavy lifting.

## 2021-07-23 ENCOUNTER — Emergency Department (HOSPITAL_COMMUNITY): Payer: Medicaid Other

## 2021-07-23 LAB — CBC WITH DIFFERENTIAL/PLATELET
Abs Immature Granulocytes: 0.01 10*3/uL (ref 0.00–0.07)
Basophils Absolute: 0.1 10*3/uL (ref 0.0–0.1)
Basophils Relative: 1 %
Eosinophils Absolute: 0.1 10*3/uL (ref 0.0–0.5)
Eosinophils Relative: 1 %
HCT: 43.4 % (ref 39.0–52.0)
Hemoglobin: 14.3 g/dL (ref 13.0–17.0)
Immature Granulocytes: 0 %
Lymphocytes Relative: 47 %
Lymphs Abs: 3 10*3/uL (ref 0.7–4.0)
MCH: 29.7 pg (ref 26.0–34.0)
MCHC: 32.9 g/dL (ref 30.0–36.0)
MCV: 90.2 fL (ref 80.0–100.0)
Monocytes Absolute: 0.5 10*3/uL (ref 0.1–1.0)
Monocytes Relative: 7 %
Neutro Abs: 2.8 10*3/uL (ref 1.7–7.7)
Neutrophils Relative %: 44 %
Platelets: 248 10*3/uL (ref 150–400)
RBC: 4.81 MIL/uL (ref 4.22–5.81)
RDW: 12.6 % (ref 11.5–15.5)
WBC: 6.5 10*3/uL (ref 4.0–10.5)
nRBC: 0 % (ref 0.0–0.2)

## 2021-07-23 LAB — MAGNESIUM: Magnesium: 2 mg/dL (ref 1.7–2.4)

## 2021-07-23 LAB — COMPREHENSIVE METABOLIC PANEL
ALT: 11 U/L (ref 0–44)
AST: 13 U/L — ABNORMAL LOW (ref 15–41)
Albumin: 3.9 g/dL (ref 3.5–5.0)
Alkaline Phosphatase: 37 U/L — ABNORMAL LOW (ref 38–126)
Anion gap: 9 (ref 5–15)
BUN: 17 mg/dL (ref 6–20)
CO2: 24 mmol/L (ref 22–32)
Calcium: 9 mg/dL (ref 8.9–10.3)
Chloride: 106 mmol/L (ref 98–111)
Creatinine, Ser: 1.13 mg/dL (ref 0.61–1.24)
GFR, Estimated: >60 mL/min (ref >60)
Glucose, Bld: 84 mg/dL (ref 70–99)
Potassium: 4.1 mmol/L (ref 3.5–5.1)
Sodium: 139 mmol/L (ref 135–145)
Total Bilirubin: 0.7 mg/dL (ref 0.3–1.2)
Total Protein: 6.3 g/dL — ABNORMAL LOW (ref 6.5–8.1)

## 2021-07-23 LAB — URINALYSIS, ROUTINE W REFLEX MICROSCOPIC
Bilirubin Urine: NEGATIVE
Glucose, UA: NEGATIVE mg/dL
Hgb urine dipstick: NEGATIVE
Ketones, ur: NEGATIVE mg/dL
Leukocytes,Ua: NEGATIVE
Nitrite: NEGATIVE
Protein, ur: NEGATIVE mg/dL
Specific Gravity, Urine: >1.03 — ABNORMAL HIGH (ref 1.005–1.030)
pH: 6 (ref 5.0–8.0)

## 2021-07-23 LAB — TROPONIN I (HIGH SENSITIVITY): Troponin I (High Sensitivity): <2 ng/L (ref <18)

## 2021-07-23 NOTE — ED Provider Notes (Signed)
[facility] EMERGENCY DEPARTMENT Provider Note   CSN: [id number] Arrival date & time: 07/22/21  2026     History  Chief Complaint  Patient presents with   Weakness    Randy Wagner is a 31 y.o. male.  The history is provided by the patient and medical records.  Weakness Randy Wagner is a 31 y.o. male who presents to the Emergency Department complaining of burning, hot hands and feet.  He also feels hot around his mouth.  This is been ongoing for a minimum of 1 month but he has difficulty describing it.  He also feels it all over his body for the last 1 to 2 weeks.  No fever, occasional acid reflux, occasional nausea.  Sometimes he feels like his stomach gurgles a lot.  No diarrhea.  Occasional constipation.   Had one episode of dysuria in October.  No sob.  Coughed in October.  Had right knee pain with bending in November.  No difficulty walking.    No known medical problems.   No meds Yes tob, alcohol occ, no drugs.     Home Medications Prior to Admission medications   Medication Sig Start Date End Date Taking? Authorizing Provider  acetaminophen (TYLENOL) 500 MG tablet Take 1,000 mg by mouth every 6 (six) hours as needed. Patient not taking: Reported on 06/08/2021    [provider]  ascorbic acid (VITAMIN C) 1000 MG tablet Take 1 tablet (1,000 mg total) by mouth daily. Patient not taking: Reported on 06/08/2021 08/24/20   Barnetta Chapel, PA-C  bacitracin ointment Apply topically 2 (two) times daily. Patient not taking: Reported on 06/08/2021 08/24/20   Barnetta Chapel, PA-C  bacitracin ointment APPLY TOPICALLY TWO TIMES DAILY. Patient not taking: Reported on 06/08/2021 08/24/20 08/24/21  Barnetta Chapel, PA-C  cephALEXin (KEFLEX) 500 MG capsule TAKE 1 CAPSULE (500 MG TOTAL) BY MOUTH EVERY EIGHT HOURS FOR 6 DAYS. Patient not taking: Reported on 06/08/2021 08/24/20 08/24/21  Barnetta Chapel, PA-C  doxycycline (VIBRAMYCIN) 100 MG capsule Take 1 capsule (100 mg  total) by mouth 2 (two) times daily. One po bid x 7 days 06/08/21   Bethann Berkshire, MD  ibuprofen (ADVIL) 800 MG tablet Take 1 tablet (800 mg total) by mouth every 6 (six) hours as needed. Patient not taking: Reported on 06/08/2021 08/03/19   Charlestine Night, PA-C  methocarbamol (ROBAXIN) 500 MG tablet Take 2 tablets (1,000 mg total) by mouth every 8 (eight) hours as needed for muscle spasms. Patient not taking: Reported on 06/08/2021 08/24/20   Barnetta Chapel, PA-C  methocarbamol (ROBAXIN) 500 MG tablet TAKE 2 TABLETS (1,000 MG TOTAL) BY MOUTH EVERY EIGHT HOURS AS NEEDED FOR MUSCLE SPASMS. Patient not taking: Reported on 06/08/2021 08/24/20 08/24/21  Barnetta Chapel, PA-C  oxyCODONE (OXY IR/ROXICODONE) 5 MG immediate release tablet Take 1-2 tablets (5-10 mg total) by mouth every 4 (four) hours as needed for moderate pain. Patient not taking: Reported on 06/08/2021 08/24/20   Barnetta Chapel, PA-C  penicillin v potassium (VEETID) 500 MG tablet Take 1 tablet (500 mg total) by mouth 4 (four) times daily. Patient not taking: Reported on 06/08/2021 08/03/19   Charlestine Night, PA-C  traMADol (ULTRAM) 50 MG tablet Take 1 tablet (50 mg total) by mouth every 6 (six) hours as needed for severe pain. Patient not taking: Reported on 06/08/2021 08/03/19   Charlestine Night, PA-C      Allergies    Patient has no known allergies.    Review of Systems  Review of Systems  Neurological:  Positive for weakness.  All other systems reviewed and are negative.  Physical Exam Updated Vital Signs BP 113/86 (BP Location: Right Arm)    Pulse 60    Temp 97.9 F (36.6 C) (Oral)    Resp 15    Ht 6' (1.829 m)    Wt 71.2 kg    SpO2 100%    BMI 21.29 kg/m  Physical Exam Vitals and nursing note reviewed.  Constitutional:      Appearance: He is well-developed.  HENT:     Head: Normocephalic and atraumatic.  Cardiovascular:     Rate and Rhythm: Regular rhythm. Bradycardia present.     Heart sounds: No murmur  heard. Pulmonary:     Effort: Pulmonary effort is normal. No respiratory distress.     Breath sounds: Normal breath sounds.  Abdominal:     Palpations: Abdomen is soft.     Tenderness: There is no abdominal tenderness. There is no guarding or rebound.  Musculoskeletal:        General: No tenderness.     Comments: 2+ DP pulses bilaterally  Skin:    General: Skin is warm and dry.  Neurological:     Mental Status: He is alert and oriented to person, place, and time.     Comments: No asymmetry of facial movements.  5 out of 5 strength in all 4 extremities with sensation to light touch intact in all 4 extremities.  2+ patellar reflexes bilaterally  Psychiatric:        Behavior: Behavior normal.    ED Results / Procedures / Treatments   Labs (all labs ordered are listed, but only abnormal results are displayed) Labs Reviewed  COMPREHENSIVE METABOLIC PANEL - Abnormal; Notable for the following components:      Result Value   Total Protein 6.3 (*)    AST 13 (*)    Alkaline Phosphatase 37 (*)    All other components within normal limits  URINALYSIS, ROUTINE W REFLEX MICROSCOPIC - Abnormal; Notable for the following components:   Specific Gravity, Urine >1.030 (*)    All other components within normal limits  CBC WITH DIFFERENTIAL/PLATELET  MAGNESIUM  TROPONIN I (HIGH SENSITIVITY)    EKG EKG Interpretation  Date/Time:  Monday July 23 2021 00:59:04 EST Ventricular Rate:  50 PR Interval:  148 QRS Duration: 92 QT Interval:  427 QTC Calculation: 390 R Axis:   83 Text Interpretation: Sinus rhythm RSR' in V1 or V2, probably normal variant ST elev, probable normal early repol pattern Confirmed by Tilden Fossaees, [place] [phone]([id number]) on 07/23/2021 1:06:51 AM  Radiology DG Chest 2 View  Result Date: 07/23/2021 CLINICAL DATA:  Weakness EXAM: CHEST - 2 VIEW COMPARISON:  08/23/2020 FINDINGS: The heart size and mediastinal contours are within normal limits. Both lungs are clear. The visualized  skeletal structures are unremarkable. IMPRESSION: No active cardiopulmonary disease. Electronically Signed   By: Alcide CleverMark  Lukens M.D.   On: 07/23/2021 01:17    Procedures Procedures    Medications Ordered in ED Medications - No data to display  ED Course/ Medical Decision Making/ A&P                           Medical Decision Making Amount and/or Complexity of Data Reviewed Labs: ordered. Radiology: ordered.   Patient here for evaluation of feeling hot and burning with paresthesias to bilateral hands and feet as well as around the mouth.  He is  nontoxic-appearing on evaluation with no focal neurologic deficits.  Current clinical picture is not consistent with space-occupying lesion, CVA, Guillain-Barr syndrome.  No significant electrolyte abnormality or evidence of anemia.  Discussed with patient unclear source of symptoms but feel he is stable for outpatient work-up.       Final Clinical Impression(s) / ED Diagnoses Final diagnoses:  Paresthesia    Rx / DC Orders ED Discharge Orders     None         Tilden Fossa, MD 07/23/21 [phone]

## 2021-07-23 NOTE — Discharge Instructions (Signed)
The cause of your symptoms was not identified today.  Please call your family doctor or follow-up with community health and wellness for further work-up of your symptoms.

## 2021-07-23 NOTE — ED Notes (Signed)
Patient verbalizes understanding of d/c instructions. Opportunities for questions and answers were provided. Pt d/c from ED and ambulated to lobby.  

## 2021-07-29 ENCOUNTER — Other Ambulatory Visit: Payer: Self-pay

## 2021-07-29 ENCOUNTER — Encounter (HOSPITAL_COMMUNITY): Payer: Self-pay

## 2021-07-29 ENCOUNTER — Ambulatory Visit (HOSPITAL_COMMUNITY)
Admission: EM | Admit: 2021-07-29 | Discharge: 2021-07-29 | Disposition: A | Discharge status: Home / Self Care | Payer: Medicaid Other | Attending: Medical Oncology | Admitting: Medical Oncology

## 2021-07-29 DIAGNOSIS — Z113 Encounter for screening for infections with a predominantly sexual mode of transmission: Secondary | ICD-10-CM | POA: Insufficient documentation

## 2021-07-29 DIAGNOSIS — Z202 Contact with and (suspected) exposure to infections with a predominantly sexual mode of transmission: Secondary | ICD-10-CM | POA: Diagnosis not present

## 2021-07-29 LAB — HIV ANTIBODY (ROUTINE TESTING W REFLEX): HIV Screen 4th Generation wRfx: NONREACTIVE

## 2021-07-29 NOTE — ED Notes (Signed)
Pt refuses the self swab for SDT
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
## 2021-07-29 NOTE — ED Triage Notes (Signed)
Pt requesting STD testing including blood work.

## 2021-07-29 NOTE — ED Notes (Signed)
Refused cytology swab

## 2021-07-30 LAB — RPR: RPR Ser Ql: NONREACTIVE

## 2021-08-11 ENCOUNTER — Emergency Department (HOSPITAL_COMMUNITY)
Admission: EM | Admit: 2021-08-11 | Discharge: 2021-08-22 | Disposition: E | Payer: Medicaid Other | Attending: Emergency Medicine | Admitting: Emergency Medicine

## 2021-08-11 DIAGNOSIS — W3400XA Accidental discharge from unspecified firearms or gun, initial encounter: Secondary | ICD-10-CM

## 2021-08-11 DIAGNOSIS — S0990XA Unspecified injury of head, initial encounter: Secondary | ICD-10-CM | POA: Diagnosis present

## 2021-08-11 DIAGNOSIS — S0100XA Unspecified open wound of scalp, initial encounter: Secondary | ICD-10-CM | POA: Insufficient documentation

## 2021-08-11 MED ORDER — EPINEPHRINE 1 MG/10ML IJ SOSY
PREFILLED_SYRINGE | INTRAMUSCULAR | Status: AC | PRN
  Administered 2021-08-11: .1 mg via INTRAVENOUS
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
## 2021-08-22 NOTE — Progress Notes (Signed)
°   09-09-2021 0625  Clinical Encounter Type  Visited With Health care provider;Family  Visit Type Death;Follow-up  Referral From Physician (Dr. Zadie Rhine)  Consult/Referral To Chaplain  Spiritual Encounters  Spiritual Needs Grief support;Emotional   Accompanied Dr. Bebe Shaggy to Consultation Room A for death notification to patient's mother, Ms. Ditina Hanna and a male relative. Provided condolences and grief support to mother. Informed that additional family would be arriving. Chaplain received contact information for other patients next of kin. Chaplain Reyn Faivre, M.Min., [phone].
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
## 2021-08-22 NOTE — ED Notes (Signed)
Medical examiner transferred to Dr. Bebe Shaggy per his request
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
## 2021-08-22 DEATH — deceased

## 2021-08-30 IMAGING — RF DG ELBOW COMPLETE 3+V*R*
1 series · 6 of 6 positions shown · non-contrast
Comparison: 08/20/2020.

CLINICAL DATA: ORIF distal humerus.

EXAM:
RIGHT ELBOW - COMPLETE 3+ VIEW; DG C-ARM 1-60 MIN

[Series 1: run · 6 of 6 slices shown]
[im 1/6]
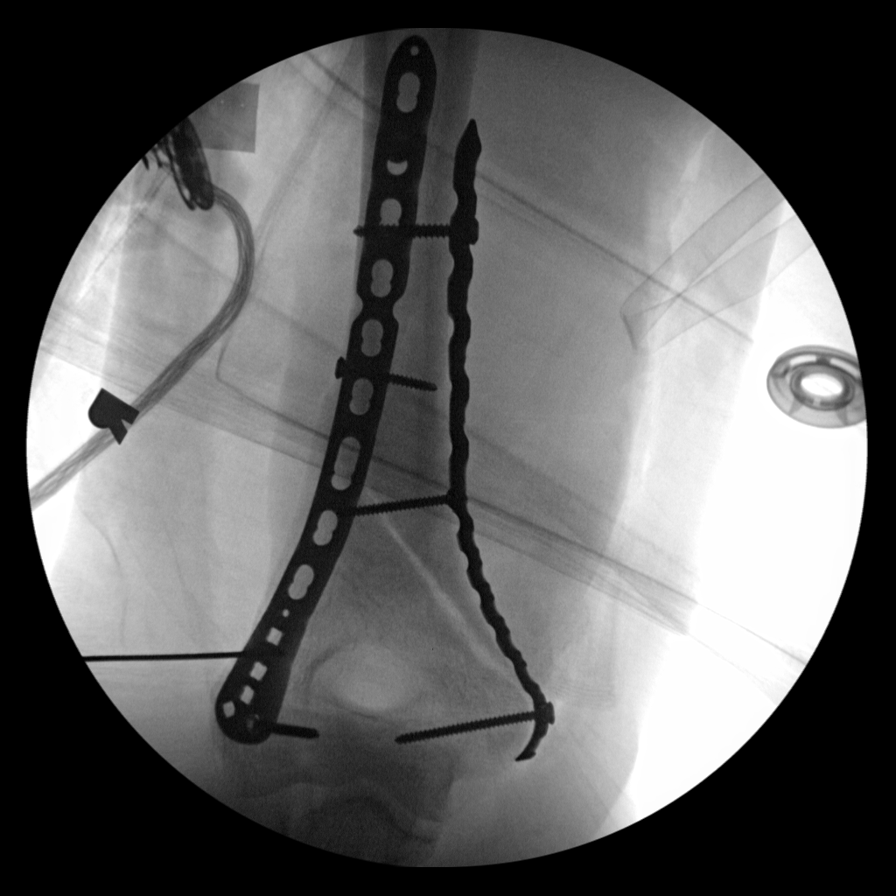
[im 2/6]
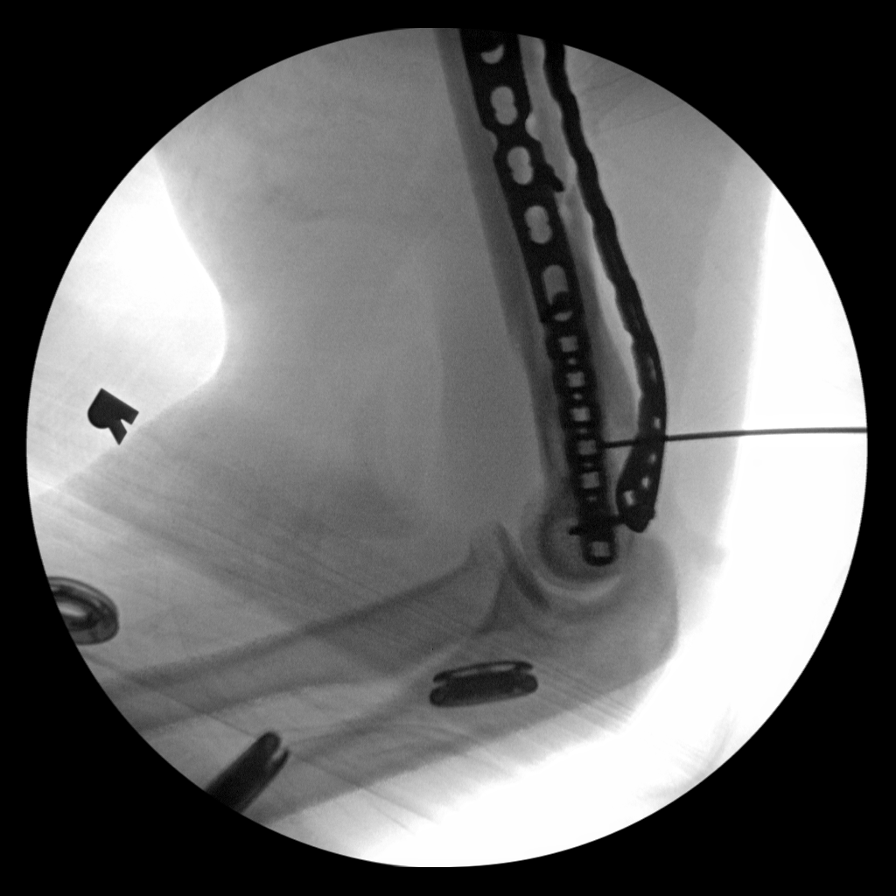
[im 3/6]
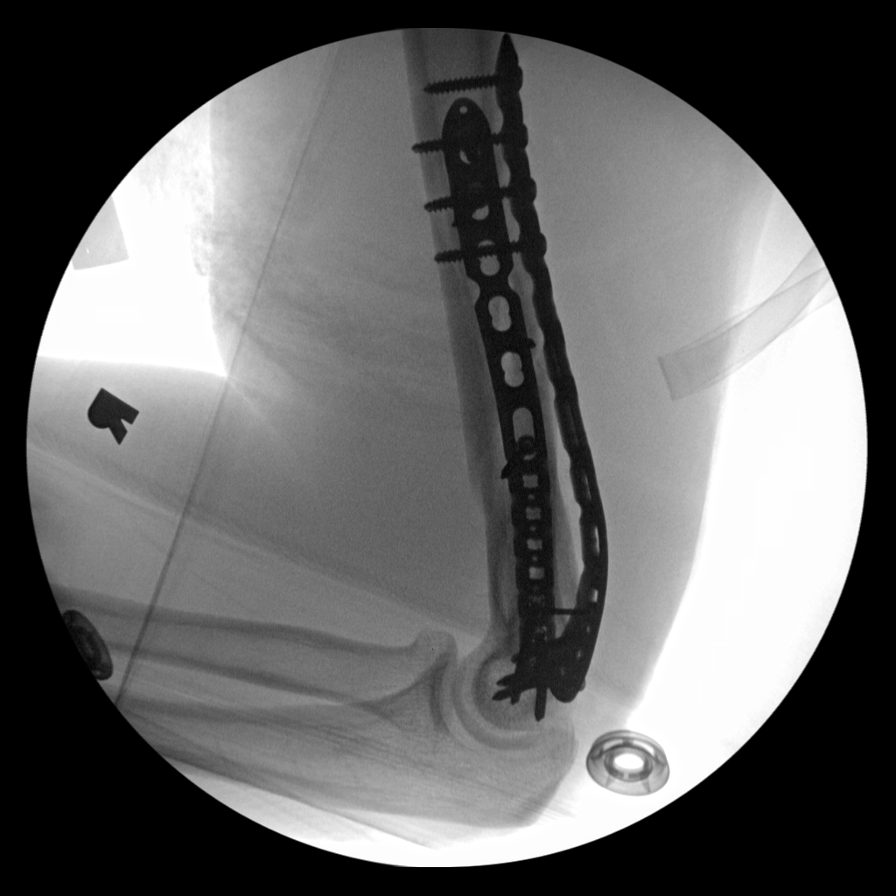
[im 4/6]
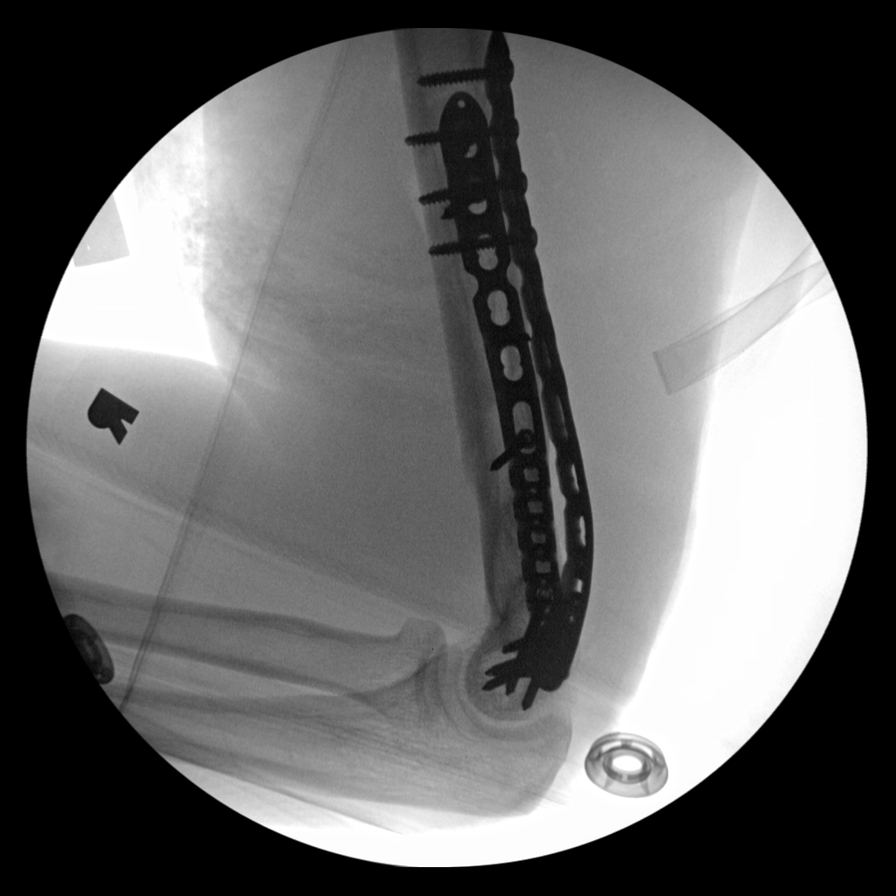
[im 5/6]
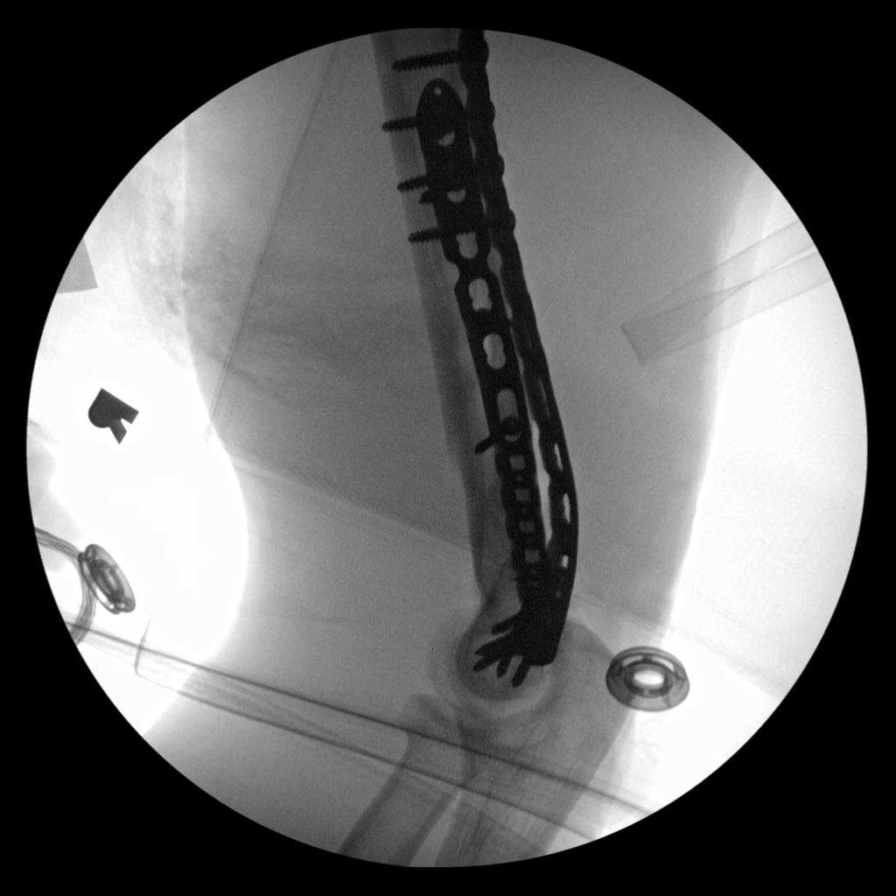
[im 6/6]
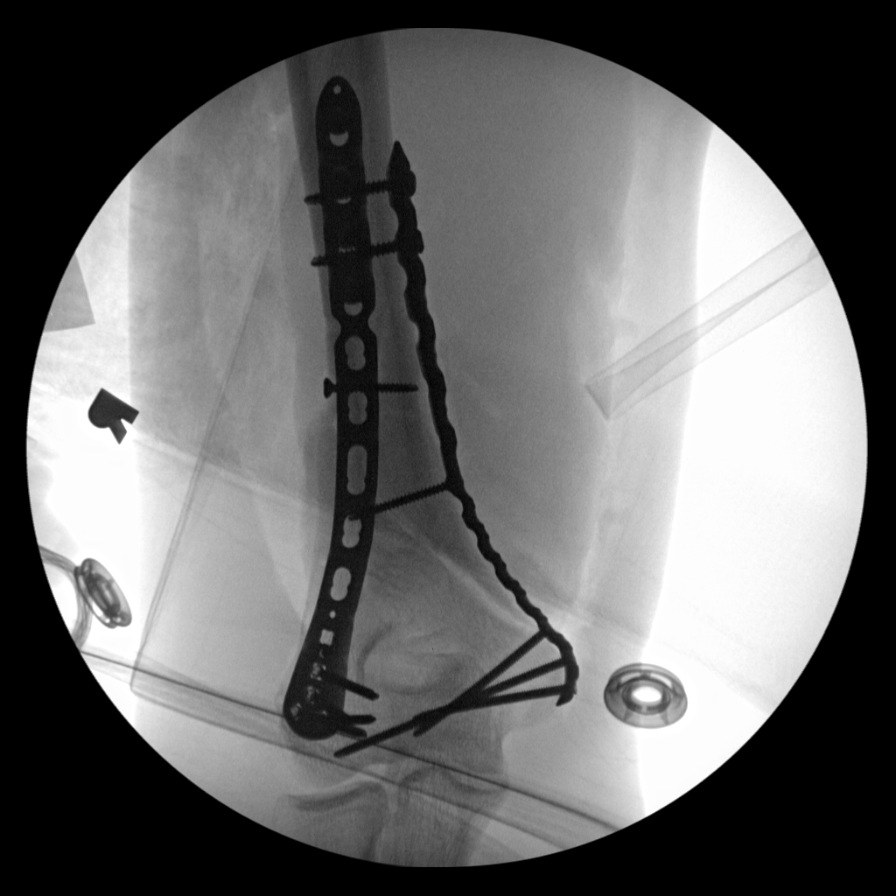

[6 of 6 positions shown; findings below may reference images not displayed]

FINDINGS: ORIF distal humerus visualized hardware intact. Slight displacement
of distal fracture fragment.
IMPRESSION: ORIF distal humerus.
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
## 2022-07-31 IMAGING — CR DG CHEST 2V
2 series · 2 of 2 positions shown · non-contrast
Comparison: 08/23/2020

CLINICAL DATA: Weakness

EXAM:
CHEST - 2 VIEW

[chest pa]
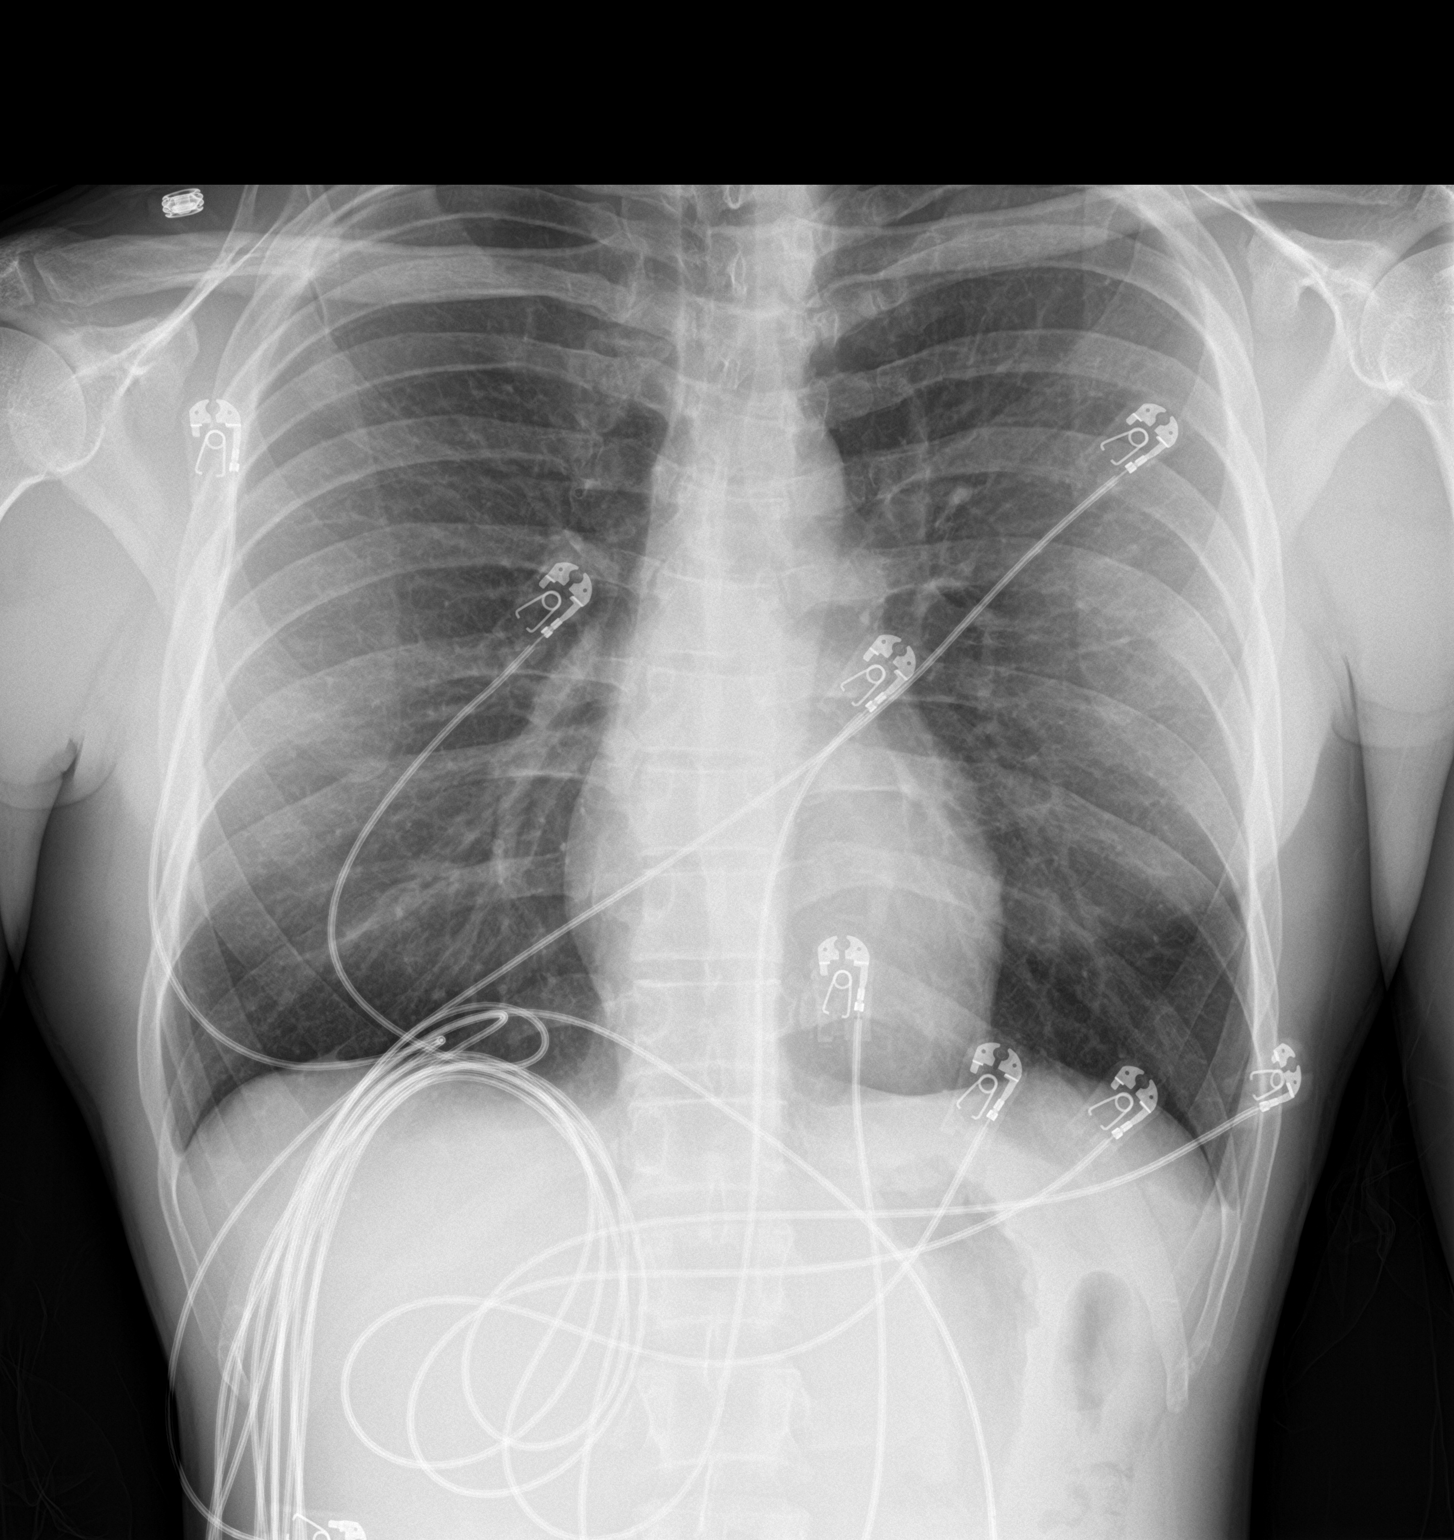

[chest lat]
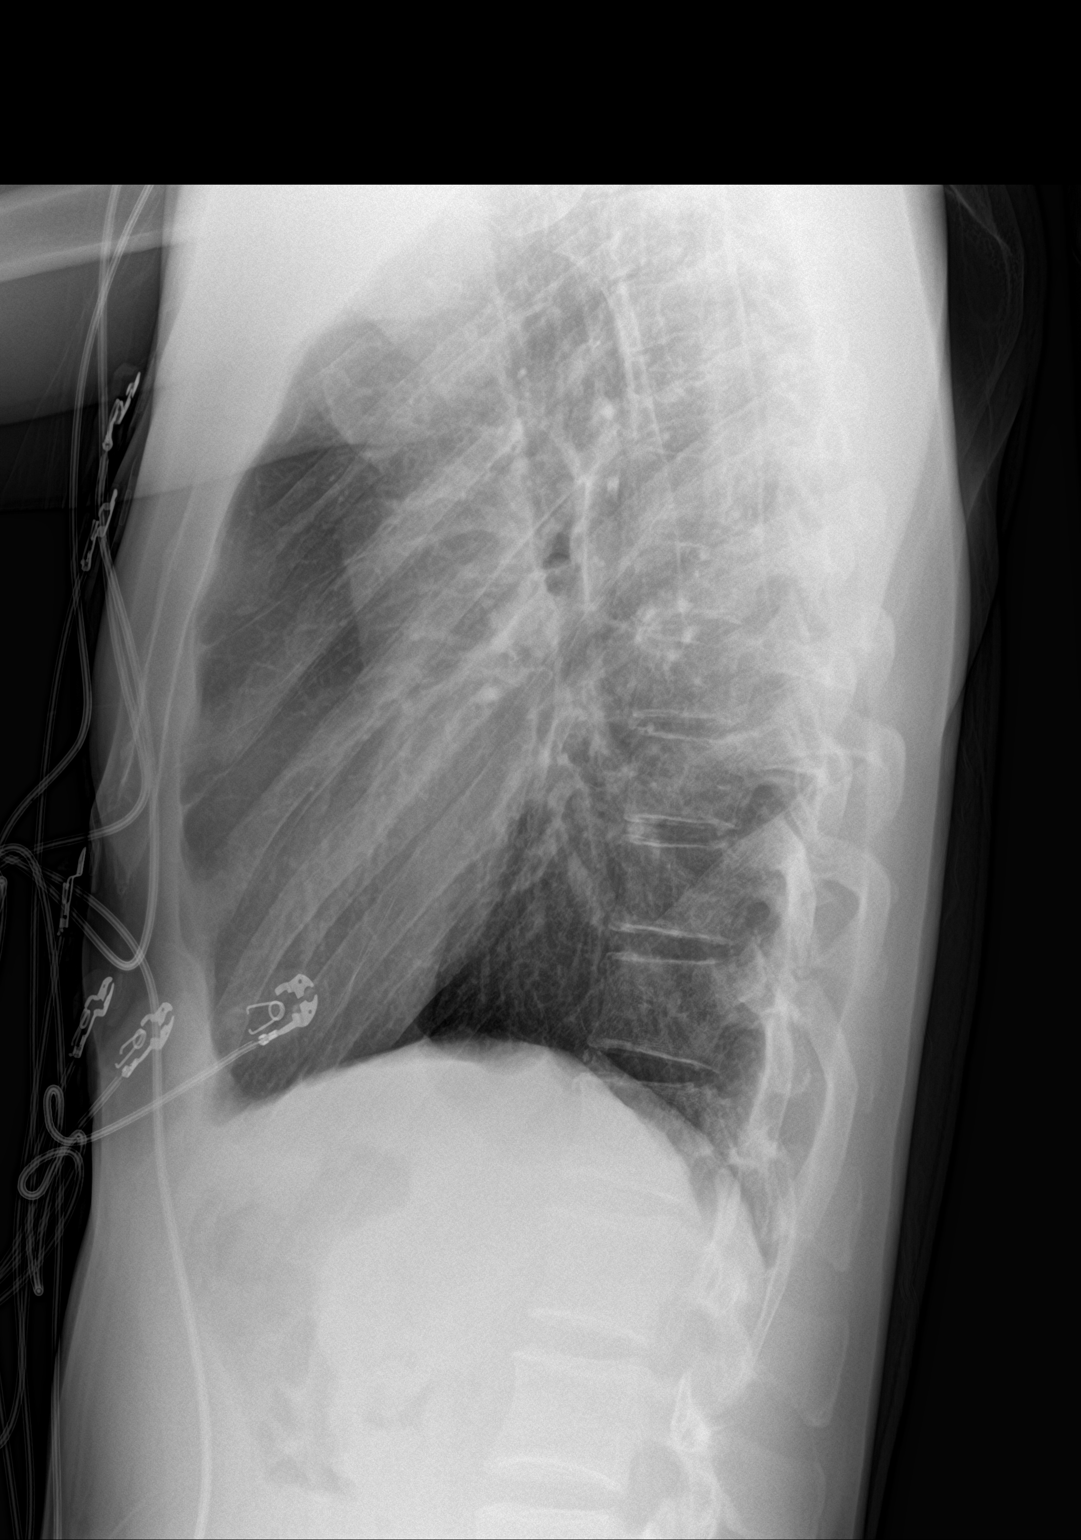

[2 of 2 positions shown; findings below may reference images not displayed]

FINDINGS: The heart size and mediastinal contours are within normal limits.
Both lungs are clear. The visualized skeletal structures are
unremarkable.
IMPRESSION: No active cardiopulmonary disease.
